# Patient Record
Sex: Female | Born: 1990 | Race: White | Hispanic: No | Marital: Single | Smoking: Never smoker
Health system: Southern US, Community
[De-identification: ages and names within clinical notes are randomized; demographics above are authoritative.]

## PROBLEM LIST (undated history)

## (undated) DIAGNOSIS — R87619 Unspecified abnormal cytological findings in specimens from cervix uteri: Secondary | ICD-10-CM

## (undated) DIAGNOSIS — Z9289 Personal history of other medical treatment: Secondary | ICD-10-CM

## (undated) DIAGNOSIS — Z23 Encounter for immunization: Secondary | ICD-10-CM

## (undated) HISTORY — DX: Personal history of other medical treatment: Z92.89

## (undated) HISTORY — DX: Encounter for immunization: Z23

## (undated) HISTORY — DX: Unspecified abnormal cytological findings in specimens from cervix uteri: R87.619

## (undated) HISTORY — PX: AUGMENTATION MAMMAPLASTY: SUR837

---
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
## 2008-04-21 ENCOUNTER — Ambulatory Visit: Payer: Self-pay | Admitting: Family Medicine

## 2008-11-30 ENCOUNTER — Ambulatory Visit: Payer: Self-pay | Admitting: Family Medicine

## 2010-05-24 ENCOUNTER — Inpatient Hospital Stay: Payer: Self-pay

## 2010-12-19 HISTORY — PX: COLPOSCOPY: SHX161

## 2011-10-29 ENCOUNTER — Ambulatory Visit: Payer: Self-pay

## 2011-10-29 LAB — RAPID INFLUENZA A&B ANTIGENS

## 2014-06-29 ENCOUNTER — Ambulatory Visit: Payer: Self-pay | Admitting: Physician Assistant

## 2014-06-29 LAB — URINALYSIS, COMPLETE
Bacteria: NEGATIVE
Bilirubin,UR: NEGATIVE
Blood: NEGATIVE
Glucose,UR: NEGATIVE
Ketone: NEGATIVE
Leukocyte Esterase: NEGATIVE
Nitrite: NEGATIVE
Ph: 7.5 (ref 5.0–8.0)
Protein: NEGATIVE
Specific Gravity: 1.02 (ref 1.000–1.030)

## 2014-06-29 LAB — PREGNANCY, URINE: Pregnancy Test, Urine: NEGATIVE m[IU]/mL

## 2016-07-05 LAB — HM PAP SMEAR: HM Pap smear: NEGATIVE

## 2016-12-28 ENCOUNTER — Encounter: Payer: Self-pay | Admitting: Advanced Practice Midwife

## 2016-12-28 ENCOUNTER — Ambulatory Visit (INDEPENDENT_AMBULATORY_CARE_PROVIDER_SITE_OTHER): Payer: Self-pay | Admitting: Advanced Practice Midwife

## 2016-12-28 VITALS — BP 122/74 | Ht 69.0 in | Wt 150.0 lb

## 2016-12-28 DIAGNOSIS — N898 Other specified noninflammatory disorders of vagina: Secondary | ICD-10-CM

## 2016-12-28 DIAGNOSIS — Z0189 Encounter for other specified special examinations: Secondary | ICD-10-CM

## 2016-12-28 DIAGNOSIS — Z113 Encounter for screening for infections with a predominantly sexual mode of transmission: Secondary | ICD-10-CM

## 2016-12-28 DIAGNOSIS — R5383 Other fatigue: Secondary | ICD-10-CM

## 2016-12-29 LAB — CBC
Hematocrit: 36.7 % (ref 34.0–46.6)
Hemoglobin: 12.2 g/dL (ref 11.1–15.9)
MCH: 30.7 pg (ref 26.6–33.0)
MCHC: 33.2 g/dL (ref 31.5–35.7)
MCV: 92 fL (ref 79–97)
Platelets: 235 10*3/uL (ref 150–379)
RBC: 3.97 x10E6/uL (ref 3.77–5.28)
RDW: 12.8 % (ref 12.3–15.4)
WBC: 6.4 10*3/uL (ref 3.4–10.8)

## 2016-12-29 LAB — TSH: TSH: 1.11 u[IU]/mL (ref 0.450–4.500)

## 2016-12-29 NOTE — Progress Notes (Signed)
S: Pt presents to clinic today with a number of complaints.  She had spotting for a week leading up to her last period. She usually has a regular period every 28 days.  She has noticed an increase in her vaginal discharge in the past week which she describes as milky white and thin. She denies other symptoms such as itching or irritation.  She has a new partner in the last six months and would like to be screened for STIs.  She has noticed in the last few months that she is always tired and would like her thyroid level checked. Discussed the possibility of iron deficiency anemia also.  She has noticed in the past few days that her urine is darker than normal. She admits to drinking two 16 oz bottles of water per day.  O: Vital Signs: BP 122/74   Ht  (1.753 m)   Wt 150 lb (68 kg)   LMP 12/11/2016   BMI 22.15 kg/m  Constitutional: Well nourished, well developed female in no acute distress.  HEENT: normal Skin: Warm and dry.  Cardiovascular: Regular rate and rhythm.   Extremity: symmetrical, without defect  Respiratory: Clear to auscultation bilateral. Normal respiratory effort Abdomen: soft, nontender, nondistended, no abnormal masses, no epigastric pain Back: no CVAT Neuro: DTRs 2+, Cranial nerves grossly intact Psych: Alert and Oriented x3. No memory deficits. Normal mood and affect.  MS: normal gait, normal bilateral lower extremity ROM/strength/stability.  Pelvic exam:  is not limited by body habitus EGBUS: within normal limits Vagina: within normal limits and with normal mucosa blood in the vault Cervix: appears normal  A: 26 y.o. G2P1 presenting with several uro/gyn issues  P:  1. Labs done today: NuSwab for STIs/Yeast/BV, CBC for hemoglobin level, TSH for symptoms of hypothyroid. Urinalysis: long dip normal 2. Encouraged increased hydration 3. Follow up as needed regarding results of labs   Tresea Mall, CNM

## 2017-01-03 LAB — NUSWAB VAGINITIS (VG)
Candida albicans, NAA: NEGATIVE
Candida glabrata, NAA: NEGATIVE
Trich vag by NAA: NEGATIVE

## 2017-03-27 ENCOUNTER — Ambulatory Visit: Payer: Self-pay | Admitting: Advanced Practice Midwife

## 2017-04-03 ENCOUNTER — Ambulatory Visit (INDEPENDENT_AMBULATORY_CARE_PROVIDER_SITE_OTHER): Payer: Medicaid Other | Admitting: Obstetrics and Gynecology

## 2017-04-03 ENCOUNTER — Encounter: Payer: Self-pay | Admitting: Obstetrics and Gynecology

## 2017-04-03 VITALS — BP 120/80 | HR 66 | Ht 69.0 in | Wt 149.0 lb

## 2017-04-03 DIAGNOSIS — R102 Pelvic and perineal pain: Secondary | ICD-10-CM | POA: Diagnosis not present

## 2017-04-03 DIAGNOSIS — N938 Other specified abnormal uterine and vaginal bleeding: Secondary | ICD-10-CM | POA: Diagnosis not present

## 2017-04-03 DIAGNOSIS — Z113 Encounter for screening for infections with a predominantly sexual mode of transmission: Secondary | ICD-10-CM

## 2017-04-03 DIAGNOSIS — Z3202 Encounter for pregnancy test, result negative: Secondary | ICD-10-CM

## 2017-04-03 LAB — POCT URINE PREGNANCY: Preg Test, Ur: NEGATIVE

## 2017-04-03 NOTE — Progress Notes (Signed)
Chief Complaint  Patient presents with  . Abdominal Pain    [facility] then started back on it,    HPI:      [name] is a 26 y.o. G2P1011 who LMP was Patient's last menstrual period was 03/08/2017., presents today for cramping for several wks and  spotting. She stopped her OCPs 5/18 and then restarted them with LMP 6/18. Since then, she has had cramping  and 1 day spotting, cramping improving this wk. She did not have to take any meds for pain. She was sex active with new partner 3 days after restarting OCPs. She had a few days of a d/c but that resolved. No urin sx, LBP, fevers, GI sx except constipation that is normal for her. No dyspareunia/postcoital bleeding.    There are no active problems to display for this patient.   Family History  Problem Relation Age of Onset  . Diabetes Mother   . Atrial fibrillation Father   . Diabetes Maternal Grandmother   . Atrial fibrillation Sister   . Aneurysm Brother        brain - half brother  . Cancer Paternal Grandfather        lung    Social History   Social History  . Marital status: Single    Spouse name: N/A  . Number of children: N/A  . 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 Smoking status: Never Smoker  . Smokeless tobacco: Never Used  . Alcohol use Yes     Comment: occ  . Drug use: No  . Sexual activity: Yes    Birth control/ protection: Pill   Other Topics Concern  . Not on file   Social History Narrative  . No narrative on file     Current Outpatient Prescriptions:  .  SPRINTEC 28 0.25-35 MG-MCG tablet, Take 1 tablet by mouth daily., Disp: , Rfl: 11  Review of Systems  Constitutional: Negative for fever.  Gastrointestinal: Negative for blood in stool, constipation, diarrhea, nausea and vomiting.  Genitourinary: Positive for pelvic pain, vaginal bleeding and vaginal discharge. Negative for dyspareunia, dysuria, flank  pain, frequency, hematuria, urgency and vaginal pain.  Musculoskeletal: Negative for back pain.  Skin: Negative for rash.     OBJECTIVE:   Vitals:  BP 120/80   Pulse 66   Ht 5\' 9"  (1.753 m)   Wt 149 lb (67.6 kg)   LMP 03/08/2017   BMI 22.00 kg/m   Physical Exam  Constitutional: She is oriented to person, place, and time and well-developed, well-nourished, and in no distress. Vital signs are normal.  Abdominal: There is no tenderness.  Genitourinary: Uterus normal, cervix normal, right adnexa normal, left adnexa normal and vulva normal. Uterus is not enlarged. Cervix exhibits no motion tenderness and no tenderness. Right adnexum displays no mass and no tenderness. Left adnexum displays no mass and no tenderness. Vulva exhibits no erythema, no exudate, no lesion, no rash and no tenderness. Vagina exhibits no lesion. Thin  clear and vaginal discharge found.  Neurological: She is oriented to person, place, and time.  Vitals reviewed.   Results: Results for orders placed or performed in visit on 04/03/17 (from the past 24 hour(s))  POCT urine pregnancy     Status: Normal   Collection Time: 04/03/17 10:44 AM  Result Value Ref Range   Preg Test, Ur Negative Negative  Assessment/Plan: DUB (dysfunctional uterine bleeding) - For 1 day on restart of OCPs. Neg UPT. Check nuswab. Follow expectantly with next pack. - Plan: POCT urine pregnancy  Pelvic cramping - neg UPT/exam. Check nuswab. Will f/u with results - Plan: POCT urine pregnancy, Chlamydia/Gonococcus/Trichomonas, NAA  Screening for STD (sexually transmitted disease) - Plan: Chlamydia/Gonococcus/Trichomonas, NAA     Return if symptoms worsen or fail to improve.  Alicia B. Copland, PA-C 04/03/2017 10:45 AM

## 2017-04-06 LAB — CHLAMYDIA/GONOCOCCUS/TRICHOMONAS, NAA
Chlamydia by NAA: NEGATIVE
Gonococcus by NAA: NEGATIVE
Trich vag by NAA: NEGATIVE

## 2017-05-20 ENCOUNTER — Ambulatory Visit (INDEPENDENT_AMBULATORY_CARE_PROVIDER_SITE_OTHER): Payer: Self-pay

## 2017-05-20 ENCOUNTER — Ambulatory Visit
Admission: EM | Admit: 2017-05-20 | Discharge: 2017-05-20 | Disposition: A | Discharge status: Home / Self Care | Payer: Self-pay | Attending: Family Medicine | Admitting: Family Medicine

## 2017-05-20 DIAGNOSIS — S9032XA Contusion of left foot, initial encounter: Secondary | ICD-10-CM

## 2017-05-20 DIAGNOSIS — S92502A Displaced unspecified fracture of left lesser toe(s), initial encounter for closed fracture: Secondary | ICD-10-CM

## 2017-05-20 NOTE — ED Triage Notes (Signed)
Pt reports [place]. Left foot 2nd toe was bent to the side but they straightened it back out right away. Now with bruising and swelling to the area. Pain 2/10

## 2017-05-20 NOTE — Discharge Instructions (Signed)
Rest, ice, buddy tape. Follow up with podiatry as needed.   Follow up with your primary care physician this week as needed. Return to Urgent care for new or worsening concerns.

## 2017-05-20 NOTE — ED Provider Notes (Signed)
[facility] ____________________________________________  Time seen: Approximately 3:20 PM  I have reviewed the triage vital signs and the nursing notes.   HISTORY  Chief Complaint Foot Pain   HPI Diana [name] is a 26 y.o. female  presenting for evaluation of right foot second toe pain after injury occurred this past Thursday. Patient states that she stubbed her toe directly on a boat tie, causing pain. Patient reports that initially the toe was laterally Displaced appearing, and states that a friend pulled on the toe several times to make it straight again. Patient states she has had continued pain and swelling to the same toe. States some pain at the base of the third and fourth toes. Denies any other pain or injury. States has been applying ice and resting as well as buddy taping some. No other alleviating measures attempted. States pain is worse with direct palpation and ambulating, but reports has continued to ambulate well. Denies break in skin.  Denies chest pain, shortness of breath, abdominal pain, or rash. Denies recent sickness. Denies recent antibiotic use.   Patient's last menstrual period was 05/06/2017. denies pregnancy.    Past Medical History:  Diagnosis Date  . Abnormal Pap smear of cervix   . History of Papanicolaou smear of cervix 05/10/2015; [id number]   neg; neg  . Immunization, viral disease    gardasil completed    There are no active problems to display for this patient.   Past Surgical History:  Procedure Laterality Date  . AUGMENTATION MAMMAPLASTY    . COLPOSCOPY  12/19/2010   lgsil     No current facility-administered medications for this encounter.   Current Outpatient Prescriptions:  .  SPRINTEC 28 0.25-35 MG-MCG tablet, Take 1 tablet by mouth daily., Disp: , Rfl: 11  Allergies Patient has no known allergies.  Family History  Problem Relation Age of Onset  . Diabetes Mother   . Atrial fibrillation Father   . Diabetes  Maternal Grandmother   . Atrial fibrillation Sister   . Aneurysm Brother        brain - half brother  . Cancer Paternal Grandfather        lung    Social History Social History  Substance Use Topics  . Smoking status: Never Smoker  . Smokeless tobacco: Never Used  . Alcohol use Yes     Comment: occ    Review of Systems Constitutional: No fever/chills. Cardiovascular: Denies chest pain. Respiratory: Denies shortness of breath. Gastrointestinal: No abdominal pain.   Musculoskeletal: Negative for back pain. As above. Skin: Negative for rash.   ____________________________________________   PHYSICAL EXAM:  VITAL SIGNS: ED Triage Vitals  Enc Vitals Group     BP 05/20/17 1411 126/71     Pulse Rate 05/20/17 1411 61     Resp 05/20/17 1411 16     Temp 05/20/17 1411 98.3 F (36.8 C)     Temp Source 05/20/17 1411 Oral     SpO2 05/20/17 1411 100 %     Weight 05/20/17 1409 148 lb (67.1 kg)     Height 05/20/17 1409 5\' 9"  (1.753 m)     Head Circumference --      Peak Flow --      Pain Score 05/20/17 1409 2     Pain Loc --      Pain Edu? --      Excl. in GC? --     Constitutional: Alert and oriented. Well appearing and in no acute distress. Cardiovascular:  Normal rate, regular rhythm. Grossly normal heart sounds.  Good peripheral circulation. Respiratory: Normal respiratory effort without tachypnea nor retractions. Breath sounds are clear and equal bilaterally. No wheezes, rales, rhonchi. Musculoskeletal: Bilateral pedal pulses equal and easily palpated. Except: Left second toe mild to moderate ecchymosis and edema, moderate tenderness palpation distally and mild proximally, slightly limited range of motion, normal distal sensation and capillary refill, mild tenderness at base of third and fourth proximal phalanxes at MTP joint with minimal ecchymosis, left foot otherwise nontender. Ambulatory with mildly antalgic gait. Neurologic:  Normal speech and language.Speech is normal.  No gait instability.  Skin:  Skin is warm, dry. Psychiatric: Mood and affect are normal. Speech and behavior are normal. Patient exhibits appropriate insight and judgment   ___________________________________________   LABS (all labs ordered are listed, but only abnormal results are displayed)  Labs Reviewed - No data to display ____________________________________________  RADIOLOGY  Dg Foot Complete Left  Addendum Date: 05/20/2017   ADDENDUM REPORT: 05/20/2017 15:17 ADDENDUM: On additional review, there is an oblique nondisplaced fracture involving the mid aspect of the 2nd proximal phalanx, visualized on the oblique radiograph. This was discussed with Helene Kelp at the time of addendum. Electronically Signed   By: Charline Bills M.D.   On: 05/20/2017 15:17   Result Date: 05/20/2017 CLINICAL DATA:  Toe pain EXAM: LEFT FOOT - COMPLETE 3+ VIEW COMPARISON:  None. FINDINGS: No fracture or dislocation is seen. The joint spaces are preserved. The visualized soft tissues are unremarkable. IMPRESSION: Negative. Electronically Signed: By: Charline Bills M.D. On: 05/20/2017 15:07   ____________________________________________   PROCEDURES Procedures    buddy tape 1/2 toes and post op shoe applied.   INITIAL IMPRESSION / ASSESSMENT AND PLAN / ED COURSE  Pertinent labs & imaging results that were available during my care of the patient were reviewed by me and considered in my medical decision making (see chart for details).  Well-appearing patient. No acute distress. Presents for left toe pain post mechanical injury. Left toe fracture nondisplaced. Encouraged buddy taping, postop shoe, ice, rest and close monitoring. Follow-up with PCP or podiatry as needed.  Discussed follow up and return parameters including no resolution or any worsening concerns. Patient verbalized understanding and agreed to plan.   ____________________________________________   FINAL CLINICAL  IMPRESSION(S) / ED DIAGNOSES  Final diagnoses:  Closed fracture of phalanx of left second toe, initial encounter  Contusion of left foot, initial encounter     New Prescriptions   No medications on file    Note: This dictation was prepared with Dragon dictation along with smaller phrase technology. Any transcriptional errors that result from this process are unintentional.         Renford Dills, NP 05/20/17 1533

## 2017-07-25 ENCOUNTER — Other Ambulatory Visit: Payer: Self-pay | Admitting: Obstetrics and Gynecology

## 2017-07-30 ENCOUNTER — Other Ambulatory Visit: Payer: Self-pay | Admitting: Obstetrics and Gynecology

## 2017-07-30 MED ORDER — SPRINTEC 28 0.25-35 MG-MCG PO TABS: 1.0000 | ORAL_TABLET | Freq: Every day | ORAL | 0 refills | Status: DC

## 2017-07-30 NOTE — Telephone Encounter (Signed)
Pt aware refill eRx'd. 

## 2017-07-30 NOTE — Telephone Encounter (Signed)
Pt is calling needing a medication refill on her Birthcontrol. [facility] [place]. Pt is schedule annual is 08/23/17 with ABC

## 2017-08-23 ENCOUNTER — Other Ambulatory Visit: Payer: Self-pay | Admitting: Obstetrics and Gynecology

## 2017-08-23 ENCOUNTER — Ambulatory Visit (INDEPENDENT_AMBULATORY_CARE_PROVIDER_SITE_OTHER): Payer: Medicaid Other | Admitting: Obstetrics and Gynecology

## 2017-08-23 ENCOUNTER — Encounter: Payer: Self-pay | Admitting: Obstetrics and Gynecology

## 2017-08-23 VITALS — BP 110/70 | HR 70 | Ht 69.0 in | Wt 156.0 lb

## 2017-08-23 DIAGNOSIS — Z124 Encounter for screening for malignant neoplasm of cervix: Secondary | ICD-10-CM | POA: Diagnosis not present

## 2017-08-23 DIAGNOSIS — Z113 Encounter for screening for infections with a predominantly sexual mode of transmission: Secondary | ICD-10-CM

## 2017-08-23 DIAGNOSIS — Z309 Encounter for contraceptive management, unspecified: Secondary | ICD-10-CM

## 2017-08-23 DIAGNOSIS — Z3041 Encounter for surveillance of contraceptive pills: Secondary | ICD-10-CM

## 2017-08-23 DIAGNOSIS — Z01419 Encounter for gynecological examination (general) (routine) without abnormal findings: Secondary | ICD-10-CM

## 2017-08-23 MED ORDER — NORGESTIMATE-ETH ESTRADIOL 0.25-35 MG-MCG PO TABS: 1.0000 | ORAL_TABLET | Freq: Every day | ORAL | 12 refills | Status: DC

## 2017-08-23 NOTE — Patient Instructions (Signed)
I value your feedback and entrusting us with your care. If you get a [facility] patient survey, I would appreciate you taking the time to let us know about your experience today. Thank you! 

## 2017-08-23 NOTE — Progress Notes (Signed)
PCP:  Patient, No Pcp Per   Chief Complaint  Patient presents with  . Gynecologic Exam     HPI:      [name] is a 26 y.o. G2P1011 who LMP was Patient's last menstrual period was 08/16/2017., presents today for her annual examination.  Her menses are regular every 28-30 days, lasting 5 days.  Dysmenorrhea mild, occurring first 1-2 days of flow. She does not usually have intermenstrual bleeding but had BTB the 3rd wk of OCPs this month, without late/missed OCPs. First time pt has had this. Normal menses this wk.  Sex activity: single partner, contraception - OCP (estrogen/progesterone).  Last Pap: July 06, 2016  Results were: no abnormalities  Hx of STDs: HPV on pap  There is no FH of breast cancer. There is no FH of ovarian cancer. The patient does not do self-breast exams.  Tobacco use: none Alcohol use: social drinker No drug use.  Exercise: moderately active  She does get adequate calcium and Vitamin D in her diet.  Gardasil completed   Past Medical History:  Diagnosis Date  . Abnormal Pap smear of cervix   . History of Papanicolaou smear of cervix 05/10/2015; [id number]   neg; neg  . Immunization, viral disease    gardasil completed    Past Surgical History:  Procedure Laterality Date  . AUGMENTATION MAMMAPLASTY    . COLPOSCOPY  12/19/2010   lgsil    Family History  Problem Relation Age of Onset  . Diabetes Mother   . Atrial fibrillation Father   . Diabetes Maternal Grandmother   . Atrial fibrillation Sister   . Aneurysm Brother        brain - half brother  . Cancer Paternal Grandfather        lung    Social History   Socioeconomic History  . Marital status: Single    Spouse name: Not on file  . Number of children: Not on file  . Years of education: 2614  . Highest education level: Not on file  Social Needs  . Financial resource strain: Not on file  . Food insecurity - worry: Not on file  . Food insecurity - inability: Not on  file  . Transportation needs - medical: Not on file  . Transportation needs - non-medical: Not on file  Occupational History  . 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[employer]  Tobacco Use  . Smoking status: Never Smoker  . Smokeless tobacco: Never Used  Substance and Sexual Activity  . Alcohol use: Yes    Comment: occ  . Drug use: No  . Sexual activity: Yes    Birth control/protection: Pill  Other Topics Concern  . Not on file  Social History Narrative  . Not on file    No outpatient medications have been marked as taking for the 08/23/17 encounter (Office Visit) with Earnestine Tuohey B, PA-C.     ROS:  Review of Systems  Constitutional: Positive for fatigue. Negative for fever and unexpected weight change.  Respiratory: Negative for cough, shortness of breath and wheezing.   Cardiovascular: Negative for chest pain, palpitations and leg swelling.  Gastrointestinal: Negative for blood in stool, constipation, diarrhea, nausea and vomiting.  Endocrine: Negative for cold intolerance, heat intolerance and polyuria.  Genitourinary: Negative for dyspareunia, dysuria, flank pain, frequency, genital sores, hematuria, menstrual problem, pelvic pain, urgency, vaginal bleeding, vaginal discharge and vaginal pain.  Musculoskeletal: Negative for back pain, joint swelling and myalgias.  Skin: Negative for rash.  Neurological: Positive  for headaches. Negative for dizziness, syncope, light-headedness and numbness.  Hematological: Negative for adenopathy.  Psychiatric/Behavioral: Negative for agitation, confusion, sleep disturbance and suicidal ideas. The patient is not nervous/anxious.      Objective: BP 110/70   Pulse 70   Ht 5\' 9"  (1.753 m)   Wt 156 lb (70.8 kg)   LMP 08/16/2017   BMI 23.04 kg/m    Physical Exam  Constitutional: She is oriented to person, place, and time. She appears well-developed and well-nourished.  Genitourinary: Vagina normal and uterus normal. There is no rash or tenderness on  the right labia. There is no rash or tenderness on the left labia. No erythema or tenderness in the vagina. No vaginal discharge found. Right adnexum does not display mass and does not display tenderness. Left adnexum does not display mass and does not display tenderness. Cervix does not exhibit motion tenderness or polyp. Uterus is not enlarged or tender.  Neck: Normal range of motion. No thyromegaly present.  Cardiovascular: Normal rate, regular rhythm and normal heart sounds.  No murmur heard. Pulmonary/Chest: Effort normal and breath sounds normal. Right breast exhibits no mass, no nipple discharge, no skin change and no tenderness. Left breast exhibits no mass, no nipple discharge, no skin change and no tenderness.  Abdominal: Soft. There is no tenderness. There is no guarding.  Musculoskeletal: Normal range of motion.  Neurological: She is alert and oriented to person, place, and time. No cranial nerve deficit.  Psychiatric: She has a normal mood and affect. Her behavior is normal.  Vitals reviewed.   Assessment/Plan: Encounter for annual routine gynecological examination  Cervical cancer screening - Plan: IGP,CtNgTv,rfx Aptima HPV ASCU  Screening for STD (sexually transmitted disease) - Plan: IGP,CtNgTv,rfx Aptima HPV ASCU  Encounter for surveillance of contraceptive pills - OCP RF. - Plan: norgestimate-ethinyl estradiol (SPRINTEC 28) 0.25-35 MG-MCG tablet  Meds ordered this encounter  Medications  . norgestimate-ethinyl estradiol (SPRINTEC 28) 0.25-35 MG-MCG tablet    Sig: Take 1 tablet by mouth daily.    Dispense:  1 Package    Refill:  12             GYN counsel adequate intake of calcium and vitamin D, diet and exercise     F/U  Return in about 1 year (around 08/23/2018).  Monae Topping B. Harwood Nall, PA-C 08/23/2017 9:03 AM

## 2017-08-25 LAB — IGP,CTNGTV,RFX APTIMA HPV ASCU
Chlamydia, Nuc. Acid Amp: NEGATIVE
Gonococcus, Nuc. Acid Amp: NEGATIVE
PAP Smear Comment: 0
Trich vag by NAA: NEGATIVE

## 2017-12-04 ENCOUNTER — Ambulatory Visit (INDEPENDENT_AMBULATORY_CARE_PROVIDER_SITE_OTHER): Payer: Medicaid Other | Admitting: Obstetrics and Gynecology

## 2017-12-04 ENCOUNTER — Encounter: Payer: Self-pay | Admitting: Obstetrics and Gynecology

## 2017-12-04 VITALS — BP 110/60 | Ht 69.0 in | Wt 164.0 lb

## 2017-12-04 DIAGNOSIS — N3001 Acute cystitis with hematuria: Secondary | ICD-10-CM

## 2017-12-04 LAB — POCT URINALYSIS DIPSTICK
Bilirubin, UA: NEGATIVE
Glucose, UA: NEGATIVE
Ketones, UA: NEGATIVE
Nitrite, UA: NEGATIVE
Protein, UA: NEGATIVE
Spec Grav, UA: 1.015 (ref 1.010–1.025)
pH, UA: 6 (ref 5.0–8.0)

## 2017-12-04 MED ORDER — NITROFURANTOIN MONOHYD MACRO 100 MG PO CAPS: 100.0000 mg | ORAL_CAPSULE | Freq: Two times a day (BID) | ORAL | 0 refills | Status: AC

## 2017-12-04 NOTE — Patient Instructions (Signed)
I value your feedback and entrusting us with your care. If you get a [facility] patient survey, I would appreciate you taking the time to let us know about your experience today. Thank you! 

## 2017-12-04 NOTE — Progress Notes (Signed)
Chief Complaint  Patient presents with  . Urinary Tract Infection    HPI:      [name] is a 27 y.o. G2P1011 who LMP was Patient's last menstrual period was 11/14/2017., presents today for UTI sx of urinary frequency, urgency, discomfort, cloudy urine, urine odor for the past few days and RT LBP since this AM. No pelvic pain/fevers. No vag sx. No hx of UTIs in the past.    Past Medical History:  Diagnosis Date  . Abnormal Pap smear of cervix   . History of Papanicolaou smear of cervix 05/10/2015; [id number]   neg; neg  . Immunization, viral disease    gardasil completed    Past Surgical History:  Procedure Laterality Date  . AUGMENTATION MAMMAPLASTY    . COLPOSCOPY  12/19/2010   lgsil    Family History  Problem Relation Age of Onset  . Diabetes Mother   . Atrial fibrillation Father   . Diabetes Maternal Grandmother   . Atrial fibrillation Sister   . Aneurysm Brother        brain - half brother  . Cancer Paternal Grandfather        lung    Social History   Socioeconomic History  . Marital status: Single    Spouse name: Not on file  . Number of children: Not on file  . Years of education: 1714  . Highest education level: Not on file  Social Needs  . Financial resource strain: Not on file  . Food insecurity - worry: Not on file  . Food insecurity - inability: Not on file  . Transportation needs - medical: Not on file  . Transportation needs - non-medical: Not on file  Occupational History  . 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[employer]  Tobacco Use  . Smoking status: Never Smoker  . Smokeless tobacco: Never Used  Substance and Sexual Activity  . Alcohol use: Yes    Comment: occ  . Drug use: No  . Sexual activity: Yes    Birth control/protection: Pill  Other Topics Concern  . Not on file  Social History Narrative  . Not on file     Current Outpatient Medications:  .  norgestimate-ethinyl estradiol (SPRINTEC 28) 0.25-35 MG-MCG tablet, Take 1 tablet by mouth  daily., Disp: 1 Package, Rfl: 12 .  nitrofurantoin, macrocrystal-monohydrate, (MACROBID) 100 MG capsule, Take 1 capsule (100 mg total) by mouth 2 (two) times daily for 5 days., Disp: 10 capsule, Rfl: 0   ROS:  Review of Systems  Constitutional: Negative for fever.  Gastrointestinal: Negative for blood in stool, constipation, diarrhea, nausea and vomiting.  Genitourinary: Positive for dysuria, frequency and urgency. Negative for dyspareunia, flank pain, hematuria, vaginal bleeding, vaginal discharge and vaginal pain.  Musculoskeletal: Positive for back pain.  Skin: Negative for rash.     OBJECTIVE:   Vitals:  BP 110/60   Ht 5\' 9"  (1.753 m)   Wt 164 lb (74.4 kg)   LMP 11/14/2017   BMI 24.22 kg/m   Physical Exam  Constitutional: She is oriented to person, place, and time and well-developed, well-nourished, and in no distress.  Pulmonary/Chest: Effort normal.  Abdominal: There is no CVA tenderness.  Musculoskeletal: Normal range of motion.  Neurological: She is alert and oriented to person, place, and time.  Psychiatric: Affect and judgment normal.  Vitals reviewed.   Results: Results for orders placed or performed in visit on 12/04/17 (from the past 24 hour(s))  POCT Urinalysis Dipstick     Status:  Abnormal   Collection Time: 12/04/17  9:56 AM  Result Value Ref Range   Color, UA yellow    Clarity, UA cloudy    Glucose, UA neg    Bilirubin, UA neg    Ketones, UA neg    Spec Grav, UA 1.015 1.010 - 1.025   Blood, UA small    pH, UA 6.0 5.0 - 8.0   Protein, UA neg    Urobilinogen, UA  0.2 or 1.0 E.U./dL   Nitrite, UA neg    Leukocytes, UA Moderate (2+) (A) Negative   Appearance     Odor      Assessment/Plan: Acute cystitis with hematuria - Pos dip. Check C&S. Rx macrobid. F/u prn.  - Plan: POCT Urinalysis Dipstick, Urine Culture, nitrofurantoin, macrocrystal-monohydrate, (MACROBID) 100 MG capsule   Meds ordered this encounter  Medications  . nitrofurantoin,  macrocrystal-monohydrate, (MACROBID) 100 MG capsule    Sig: Take 1 capsule (100 mg total) by mouth 2 (two) times daily for 5 days.    Dispense:  10 capsule    Refill:  0    Order Specific Question:   Supervising Provider    Answer:   Nadara Mustard [[id number]]     Return if symptoms worsen or fail to improve.  Yalissa Fink B. Melquan Ernsberger, PA-C 12/04/2017 9:58 AM

## 2017-12-06 LAB — URINE CULTURE

## 2018-07-24 ENCOUNTER — Other Ambulatory Visit: Payer: Self-pay | Admitting: Obstetrics and Gynecology

## 2018-07-24 ENCOUNTER — Other Ambulatory Visit: Payer: Self-pay

## 2018-07-24 DIAGNOSIS — Z3041 Encounter for surveillance of contraceptive pills: Secondary | ICD-10-CM

## 2018-07-24 MED ORDER — NORGESTIMATE-ETH ESTRADIOL 0.25-35 MG-MCG PO TABS: 1.0000 | ORAL_TABLET | Freq: Every day | ORAL | 0 refills | Status: DC

## 2018-08-26 ENCOUNTER — Encounter: Payer: Self-pay | Admitting: Obstetrics and Gynecology

## 2018-08-26 ENCOUNTER — Other Ambulatory Visit (HOSPITAL_COMMUNITY)
Admission: RE | Admit: 2018-08-26 | Discharge: 2018-08-26 | Disposition: A | Discharge status: Home / Self Care | Payer: Medicaid Other | Source: Ambulatory Visit | Attending: Obstetrics and Gynecology | Admitting: Obstetrics and Gynecology

## 2018-08-26 ENCOUNTER — Ambulatory Visit (INDEPENDENT_AMBULATORY_CARE_PROVIDER_SITE_OTHER): Payer: Medicaid Other | Admitting: Obstetrics and Gynecology

## 2018-08-26 VITALS — BP 116/60 | HR 65 | Ht 69.0 in | Wt 162.0 lb

## 2018-08-26 DIAGNOSIS — Z124 Encounter for screening for malignant neoplasm of cervix: Secondary | ICD-10-CM | POA: Insufficient documentation

## 2018-08-26 DIAGNOSIS — Z0000 Encounter for general adult medical examination without abnormal findings: Secondary | ICD-10-CM | POA: Diagnosis not present

## 2018-08-26 DIAGNOSIS — Z01419 Encounter for gynecological examination (general) (routine) without abnormal findings: Secondary | ICD-10-CM

## 2018-08-26 DIAGNOSIS — Z3041 Encounter for surveillance of contraceptive pills: Secondary | ICD-10-CM

## 2018-08-26 DIAGNOSIS — Z3169 Encounter for other general counseling and advice on procreation: Secondary | ICD-10-CM

## 2018-08-26 MED ORDER — NORGESTIMATE-ETH ESTRADIOL 0.25-35 MG-MCG PO TABS: 1.0000 | ORAL_TABLET | Freq: Every day | ORAL | 3 refills | Status: DC

## 2018-08-26 NOTE — Patient Instructions (Signed)
I value your feedback and entrusting us with your care. If you get a [facility] patient survey, I would appreciate you taking the time to let us know about your experience today. Thank you! 

## 2018-08-26 NOTE — Progress Notes (Signed)
PCP:  Patient, No Pcp Per   Chief Complaint  Patient presents with  . Gynecologic Exam     HPI:      [name] is a 27 y.o. G2P1011 who LMP was Patient's last menstrual period was 08/17/2018 (approximate)., presents today for her annual examination.  Her menses are regular every 28-30 days, lasting 5 days.  Dysmenorrhea mild, occurring first 1-2 days of flow. She occas has intermenstrual bleeding, usually with late/missed OCPs.   Sex activity: single partner, contraception - OCP (estrogen/progesterone). May want to conceive next yr. Last Pap: 08/23/17 Results were: no abnormalities  Hx of STDs: HPV on pap  There is no FH of breast cancer. There is no FH of ovarian cancer. The patient does do self-breast exams.  Tobacco use: none Alcohol use: social drinker No drug use.  Exercise: not active  She does not get adequate calcium and Vitamin D in her diet.  Gardasil completed   Past Medical History:  Diagnosis Date  . Abnormal Pap smear of cervix   . History of Papanicolaou smear of cervix 05/10/2015; [id number]   neg; neg  . Immunization, viral disease    gardasil completed    Past Surgical History:  Procedure Laterality Date  . AUGMENTATION MAMMAPLASTY    . COLPOSCOPY  12/19/2010   lgsil    Family History  Problem Relation Age of Onset  . Diabetes Mother        Type 2  . Atrial fibrillation Father   . Diabetes Maternal Grandmother   . Atrial fibrillation Sister   . Aneurysm Brother        brain - half brother  . Cancer Paternal Grandfather        lung    Social History   Socioeconomic History  . Marital status: Single    Spouse name: Not on file  . Number of children: Not on file  . Years of education: 7014  . Highest education level: Not on file  Occupational History  . 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IR STYLIST  Social Needs  . Financial resource strain: Not on file  . Food insecurity:    Worry: Not on file    Inability: Not on file  . Transportation  needs:    Medical: Not on file    Non-medical: Not on file  Tobacco Use  . Smoking status: Never Smoker  . Smokeless tobacco: Never Used  Substance and Sexual Activity  . Alcohol use: Yes    Comment: occ  . Drug use: No  . Sexual activity: Yes    Birth control/protection: Pill  Lifestyle  . Physical activity:    Days per week: Not on file    Minutes per session: Not on file  . Stress: Not on file  Relationships  . Social connections:    Talks on phone: Not on file    Gets together: Not on file    Attends religious service: Not on file    Active member of club or organization: Not on file    Attends meetings of clubs or organizations: Not on file    Relationship status: Not on file  . Intimate partner violence:    Fear of current or ex partner: Not on file    Emotionally abused: Not on file    Physically abused: Not on file    Forced sexual activity: Not on file  Other Topics Concern  . Not on file  Social History Narrative  . Not on file  Current Meds  Medication Sig  . norgestimate-ethinyl estradiol (SPRINTEC 28) 0.25-35 MG-MCG tablet Take 1 tablet by mouth daily.  . [DISCONTINUED] norgestimate-ethinyl estradiol (SPRINTEC 28) 0.25-35 MG-MCG tablet Take 1 tablet by mouth daily.     ROS:  Review of Systems  Constitutional: Positive for fatigue. Negative for fever and unexpected weight change.  Respiratory: Negative for cough, shortness of breath and wheezing.   Cardiovascular: Negative for chest pain, palpitations and leg swelling.  Gastrointestinal: Positive for diarrhea. Negative for blood in stool, constipation, nausea and vomiting.  Endocrine: Negative for cold intolerance, heat intolerance and polyuria.  Genitourinary: Negative for dyspareunia, dysuria, flank pain, frequency, genital sores, hematuria, menstrual problem, pelvic pain, urgency, vaginal bleeding, vaginal discharge and vaginal pain.  Musculoskeletal: Negative for back pain, joint swelling and  myalgias.  Skin: Negative for rash.  Neurological: Positive for dizziness and headaches. Negative for syncope, light-headedness and numbness.  Hematological: Negative for adenopathy.  Psychiatric/Behavioral: Positive for agitation. Negative for confusion, sleep disturbance and suicidal ideas. The patient is not nervous/anxious.      Objective: BP 116/60   Pulse 65   Ht 5\' 9"  (1.753 m)   Wt 162 lb (73.5 kg)   LMP 08/17/2018 (Approximate)   BMI 23.92 kg/m    Physical Exam  Constitutional: She is oriented to person, place, and time. She appears well-developed and well-nourished.  Genitourinary: Vagina normal and uterus normal. There is no rash or tenderness on the right labia. There is no rash or tenderness on the left labia. No erythema or tenderness in the vagina. No vaginal discharge found. Right adnexum does not display mass and does not display tenderness. Left adnexum does not display mass and does not display tenderness. Cervix does not exhibit motion tenderness or polyp. Uterus is not enlarged or tender.  Neck: Normal range of motion. No thyromegaly present.  Cardiovascular: Normal rate, regular rhythm and normal heart sounds.  No murmur heard. Pulmonary/Chest: Effort normal and breath sounds normal. Right breast exhibits no mass, no nipple discharge, no skin change and no tenderness. Left breast exhibits no mass, no nipple discharge, no skin change and no tenderness.  Abdominal: Soft. There is no tenderness. There is no guarding.  Musculoskeletal: Normal range of motion.  Neurological: She is alert and oriented to person, place, and time. No cranial nerve deficit.  Psychiatric: She has a normal mood and affect. Her behavior is normal.  Vitals reviewed.   Assessment/Plan: Encounter for annual routine gynecological examination  Cervical cancer screening - Plan: Cytology - PAP  Encounter for surveillance of contraceptive pills - OCP RF. - Plan: norgestimate-ethinyl estradiol  (SPRINTEC 28) 0.25-35 MG-MCG tablet  Pre-conception counseling - STart PNVs. F/u prn.   Meds ordered this encounter  Medications  . norgestimate-ethinyl estradiol (SPRINTEC 28) 0.25-35 MG-MCG tablet    Sig: Take 1 tablet by mouth daily.    Dispense:  3 Package    Refill:  3    Order Specific Question:   Supervising Provider    Answer:   Nadara Mustard [[id number]]             GYN counsel adequate intake of calcium and vitamin D, diet and exercise     F/U  Return in about 1 year (around 08/27/2019).  Julienne Vogler B. Damian Buckles, PA-C 08/26/2018 2:02 PM

## 2018-08-28 LAB — CYTOLOGY - PAP: Diagnosis: NEGATIVE

## 2018-09-25 NOTE — L&D Delivery Note (Signed)
Date of delivery: 05/17/2019 Estimated Date of Delivery: 05/24/19 Patient's last menstrual period was 08/17/2018 (approximate). EGA: [redacted]w[redacted]d  Delivery Note At 8:46 PM a viable female was delivered via Vaginal, Spontaneous (Presentation: OA;  ROA).  APGAR: 8, 9; weight:  3740 g, 8#4oz.   Placenta status: spontaneous, intact.  Cord:  with the following complications: none.  Cord pH: NA  Patient labored down and pushed well to deliver a viable female infant.  The head followed by shoulders, which delivered without difficulty, and the rest of the body.  Body nuchal cord noted reduced after delivery.  Baby to mom's chest.  Cord clamped and cut after 3 min delay.  No cord blood obtained.  Placenta delivered spontaneously, intact, with a 3-vessel cord.   All counts correct.  Hemostasis obtained with IV pitocin and fundal massage.     Anesthesia: epidural  Episiotomy: none  Lacerations:  Superficial bilateral labial abrasions hemostatic Suture Repair: no repair Est. Blood Loss (mL):  400  Mom to postpartum.  Baby to Couplet care / Skin to Skin.  Rod Can, CNM 05/17/2019, 9:13 PM

## 2018-09-26 ENCOUNTER — Encounter: Payer: Medicaid Other | Admitting: Obstetrics and Gynecology

## 2018-10-08 ENCOUNTER — Encounter: Payer: Self-pay | Admitting: Certified Nurse Midwife

## 2018-10-09 ENCOUNTER — Encounter: Payer: Medicaid Other | Admitting: Obstetrics and Gynecology

## 2018-10-09 ENCOUNTER — Other Ambulatory Visit (HOSPITAL_COMMUNITY)
Admission: RE | Admit: 2018-10-09 | Discharge: 2018-10-09 | Disposition: A | Discharge status: Home / Self Care | Payer: Medicaid Other | Source: Ambulatory Visit | Attending: Obstetrics and Gynecology | Admitting: Obstetrics and Gynecology

## 2018-10-09 ENCOUNTER — Encounter: Payer: Self-pay | Admitting: Maternal Newborn

## 2018-10-09 ENCOUNTER — Ambulatory Visit (INDEPENDENT_AMBULATORY_CARE_PROVIDER_SITE_OTHER): Payer: Medicaid Other | Admitting: Maternal Newborn

## 2018-10-09 ENCOUNTER — Ambulatory Visit (INDEPENDENT_AMBULATORY_CARE_PROVIDER_SITE_OTHER): Payer: Medicaid Other

## 2018-10-09 ENCOUNTER — Other Ambulatory Visit: Payer: Self-pay | Admitting: Maternal Newborn

## 2018-10-09 VITALS — BP 120/80 | Wt 168.0 lb

## 2018-10-09 DIAGNOSIS — Z3481 Encounter for supervision of other normal pregnancy, first trimester: Secondary | ICD-10-CM | POA: Diagnosis not present

## 2018-10-09 DIAGNOSIS — Z3201 Encounter for pregnancy test, result positive: Secondary | ICD-10-CM | POA: Diagnosis not present

## 2018-10-09 DIAGNOSIS — Z3A01 Less than 8 weeks gestation of pregnancy: Secondary | ICD-10-CM

## 2018-10-09 DIAGNOSIS — Z3687 Encounter for antenatal screening for uncertain dates: Secondary | ICD-10-CM

## 2018-10-09 DIAGNOSIS — Z369 Encounter for antenatal screening, unspecified: Secondary | ICD-10-CM

## 2018-10-09 DIAGNOSIS — Z113 Encounter for screening for infections with a predominantly sexual mode of transmission: Secondary | ICD-10-CM | POA: Insufficient documentation

## 2018-10-09 DIAGNOSIS — N926 Irregular menstruation, unspecified: Secondary | ICD-10-CM

## 2018-10-09 DIAGNOSIS — Z3480 Encounter for supervision of other normal pregnancy, unspecified trimester: Secondary | ICD-10-CM | POA: Insufficient documentation

## 2018-10-09 LAB — POCT URINALYSIS DIPSTICK OB
Glucose, UA: NEGATIVE
POC,PROTEIN,UA: NEGATIVE

## 2018-10-09 LAB — POCT URINE PREGNANCY: Preg Test, Ur: POSITIVE — AB

## 2018-10-09 NOTE — Progress Notes (Signed)
10/09/2018   Chief Complaint: Amenorrhea, positive home pregnancy test, desires prenatal care.  Transfer of Care Patient: no  History of Present Illness: Diana [name] is a 28 y.o. G3P1011 at [redacted]w[redacted]d based on Patient's last menstrual period on 08/17/2018 (approximate), with an Estimated Date of Delivery: 05/24/2019, with the above CC.   Her periods were: regular periods every 28-30 days. Her last period started and ended early, lasted for 5 days. She has Positive signs or symptoms of nausea of pregnancy. She has Negative signs or symptoms of miscarriage or preterm labor She identifies Negative Zika risk factors for her and her partner On any different medications around the time she conceived/early pregnancy: No  History of varicella: Yes    Review of Systems  Constitutional: Negative.   HENT: Negative.   Eyes: Negative.   Respiratory: Negative for shortness of breath and wheezing.   Cardiovascular: Negative for chest pain and palpitations.  Gastrointestinal: Positive for nausea.  Genitourinary: Positive for frequency.  Musculoskeletal: Negative.   Skin: Negative.   Neurological: Positive for dizziness.  Endo/Heme/Allergies: Negative.   Psychiatric/Behavioral: Negative.   Breasts: positive for breast tenderness  ROS: Review of systems was otherwise negative, except as stated in the above HPI.  OBGYN History: As per HPI. OB History  Gravida Para Term Preterm AB Living  3 1 1   1 1   SAB TAB Ectopic Multiple Live Births    1     1    # Outcome Date GA Lbr Len/2nd Weight Sex Delivery Anes PTL Lv  3 Current           2 Term 05/24/10   7 lb 11 oz (3.487 kg) F Vag-Spont   LIV  1 TAB             Any issues with any prior pregnancies: no Any prior children are healthy, doing well, without any problems or issues: yes History of pap smears: Yes. Last pap smear 08/26/2018. NILM. History of STIs: No   Past Medical History: Past Medical History:  Diagnosis Date  . Abnormal Pap  smear of cervix   . History of Papanicolaou smear of cervix 05/10/2015; [id number]   neg; neg  . Immunization, viral disease    gardasil completed    Past Surgical History: Past Surgical History:  Procedure Laterality Date  . AUGMENTATION MAMMAPLASTY    . COLPOSCOPY  12/19/2010   lgsil    Family History:  Family History  Problem Relation Age of Onset  . Diabetes Mother        Type 2  . Atrial fibrillation Father   . Diabetes Maternal Grandmother   . Atrial fibrillation Sister   . Aneurysm Brother        brain - half brother  . Cancer Paternal Grandfather        lung   She denies any female cancers, bleeding or blood clotting disorders.  She denies any history of intellectual disability, birth defects or genetic disorders in her or the FOB's history  Social History:  Social History   Socioeconomic History  . Marital status: Single    Spouse name: Not on file  . Number of children: Not on file  . Years of education: 49  . Highest education level: Not on file  Occupational History  . 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IR STYLIST  Social Needs  . Financial resource strain: Not on file  . Food insecurity:    Worry: Not on file    Inability: Not on file  .  Transportation needs:    Medical: Not on file    Non-medical: Not on file  Tobacco Use  . Smoking status: Never Smoker  . Smokeless tobacco: Never Used  Substance and Sexual Activity  . Alcohol use: Yes    Comment: occ  . Drug use: No  . Sexual activity: Yes    Birth control/protection: Pill  Lifestyle  . Physical activity:    Days per week: Not on file    Minutes per session: Not on file  . Stress: Not on file  Relationships  . Social connections:    Talks on phone: Not on file    Gets together: Not on file    Attends religious service: Not on file    Active member of club or organization: Not on file    Attends meetings of clubs or organizations: Not on file    Relationship status: Not on file  . Intimate partner  violence:    Fear of current or ex partner: Not on file    Emotionally abused: Not on file    Physically abused: Not on file    Forced sexual activity: Not on file  Other Topics Concern  . Not on file  Social History Narrative  . Not on file   Any cats in the household: yes, she is avoiding contact with cat litter and feces Domestic violence screening negative.  Allergy: No Known Allergies  Current Outpatient Medications:  Current Outpatient Medications:  .  norgestimate-ethinyl estradiol (SPRINTEC 28) 0.25-35 MG-MCG tablet, Take 1 tablet by mouth daily. (Patient not taking: Reported on 10/09/2018), Disp: 3 Package, Rfl: 3   Physical Exam:   BP 120/80   Wt 168 lb (76.2 kg)   LMP 08/17/2018 (Approximate)   BMI 24.81 kg/m  Body mass index is 24.81 kg/m. Constitutional: Well nourished, well developed female in no acute distress.  Neck:  Supple, normal appearance, and no thyromegaly  Cardiovascular: S1, S2 normal, no murmur, rub or gallop, regular rate and rhythm Respiratory:  Clear to auscultation bilaterally. Normal respiratory effort Abdomen: No masses, hernias; diffusely non tender to palpation, non distended Breasts: patient declines to have breast exam. Neuro/Psych:  Normal mood and affect.  Skin:  Warm and dry.  Lymphatic:  No inguinal lymphadenopathy.   External genitalia, Bartholin's glands, Urethra, Skene's glands: within normal limits Vagina: within normal limits and with no blood in the vault  Uterus:  normal contour Adnexa:  no mass, fullness, tenderness  Assessment: Diana [name] is a 28 y.o. G3P1011 at 3530w4d based on Patient's last menstrual period on 08/17/2018 (approximate), with an Estimated Date of Delivery: 05/24/2019, presenting for prenatal care.  Plan:  1) Avoid alcoholic beverages. 2) Patient encouraged not to smoke.  3) Discontinue the use of all non-medicinal drugs and chemicals.  4) Take prenatal vitamins daily.  5) Seatbelt use advised. 6)  Nutrition, food safety (fish, cheese advisories, and high nitrite foods) and exercise discussed. 7) [facility] discussed  8) Patient is asked about travel to areas at risk for the Zika virus, and counseled to avoid travel and exposure to mosquitoes or people who may have themselves been exposed to the virus.   9) [facility] 10) Genetic Screening, such as with 1st Trimester Screening, cell free fetal DNA, AFP testing, and Ultrasound, is discussed with patient. She plans to have genetic testing this pregnancy. 11) Dating scan today showed a singleton IUP with size=dates, FHR 154. 12) Does not currently  desire medication for nausea.  Problem list reviewed and updated.  Marcelyn BruinsJacelyn , CNM [facility], [facility] 10/09/2018

## 2018-10-10 LAB — URINE DRUG PANEL 7
Amphetamines, Urine: NEGATIVE ng/mL
Barbiturate Quant, Ur: NEGATIVE ng/mL
Benzodiazepine Quant, Ur: NEGATIVE ng/mL
Cannabinoid Quant, Ur: NEGATIVE ng/mL
Cocaine (Metab.): NEGATIVE ng/mL
Opiate Quant, Ur: NEGATIVE ng/mL
PCP Quant, Ur: NEGATIVE ng/mL

## 2018-10-10 LAB — RPR+RH+ABO+RUB AB+AB SCR+CB...
Antibody Screen: NEGATIVE
HIV Screen 4th Generation wRfx: NONREACTIVE
Hematocrit: 37.7 % (ref 34.0–46.6)
Hemoglobin: 12.5 g/dL (ref 11.1–15.9)
Hepatitis B Surface Ag: NEGATIVE
MCH: 30.9 pg (ref 26.6–33.0)
MCHC: 33.2 g/dL (ref 31.5–35.7)
MCV: 93 fL (ref 79–97)
Platelets: 215 10*3/uL (ref 150–450)
RBC: 4.05 x10E6/uL (ref 3.77–5.28)
RDW: 12.1 % (ref 11.7–15.4)
RPR Ser Ql: NONREACTIVE
Rh Factor: POSITIVE
Rubella Antibodies, IGG: 1.4 index (ref >0.99)
Varicella zoster IgG: 533 index (ref >165)
WBC: 7.1 10*3/uL (ref 3.4–10.8)

## 2018-10-10 LAB — CERVICOVAGINAL ANCILLARY ONLY
Chlamydia: NEGATIVE
Neisseria Gonorrhea: NEGATIVE

## 2018-10-11 LAB — URINE CULTURE

## 2018-10-16 ENCOUNTER — Other Ambulatory Visit: Payer: Self-pay | Admitting: Maternal Newborn

## 2018-10-16 ENCOUNTER — Telehealth: Payer: Self-pay

## 2018-10-16 DIAGNOSIS — Z20828 Contact with and (suspected) exposure to other viral communicable diseases: Secondary | ICD-10-CM

## 2018-10-16 MED ORDER — OSELTAMIVIR PHOSPHATE 75 MG PO CAPS: 75.0000 mg | ORAL_CAPSULE | Freq: Every day | ORAL | 0 refills | Status: AC

## 2018-10-16 NOTE — Progress Notes (Signed)
Rx for Tamiflu, daughter has been diagnosed with influenza.

## 2018-10-16 NOTE — Telephone Encounter (Signed)
Per JYS will call in tamiflu.  Flu shot won't help b/c it takes it two weeks to be in effect.  Pt aware.

## 2018-10-16 NOTE — Telephone Encounter (Signed)
Pt's daughter has been dx'd c the flu.  Should pt get flu shot today or what to do.  She will be staying home c daughter tomorrow.  [phone]

## 2018-10-31 ENCOUNTER — Telehealth: Payer: Self-pay

## 2018-10-31 NOTE — Telephone Encounter (Signed)
Pt is starting to come down with a head cold.  What OTC meds can she take?  [phone]  Adv plain robitussin, plain sudafed, Hall's cough drops, warm salt water gargles, sucrets, e.s. tylenol, sip hot beverages/soups, push fluids and rest.

## 2018-11-04 ENCOUNTER — Encounter: Payer: Self-pay | Admitting: Obstetrics and Gynecology

## 2018-11-04 ENCOUNTER — Ambulatory Visit (INDEPENDENT_AMBULATORY_CARE_PROVIDER_SITE_OTHER): Payer: Medicaid Other | Admitting: Obstetrics and Gynecology

## 2018-11-04 VITALS — BP 122/76 | Wt 169.0 lb

## 2018-11-04 DIAGNOSIS — Z1379 Encounter for other screening for genetic and chromosomal anomalies: Secondary | ICD-10-CM

## 2018-11-04 DIAGNOSIS — Z3480 Encounter for supervision of other normal pregnancy, unspecified trimester: Secondary | ICD-10-CM

## 2018-11-04 DIAGNOSIS — Z3481 Encounter for supervision of other normal pregnancy, first trimester: Secondary | ICD-10-CM

## 2018-11-04 DIAGNOSIS — Z3A11 11 weeks gestation of pregnancy: Secondary | ICD-10-CM

## 2018-11-04 LAB — POCT URINALYSIS DIPSTICK OB
Glucose, UA: NEGATIVE
POC,PROTEIN,UA: NEGATIVE

## 2018-11-04 NOTE — Addendum Note (Signed)
Addended by: Liliane Shi on: 11/04/2018 08:53 AM   Modules accepted: Orders

## 2018-11-04 NOTE — Progress Notes (Signed)
No vb. No lof. Cold symptoms.

## 2018-11-04 NOTE — Progress Notes (Signed)
    Routine Prenatal Care Visit  Subjective  Diana [name] is a 28 y.o. G3P1011 at [redacted]w[redacted]d being seen today for ongoing prenatal care.  She is currently monitored for the following issues for this low-risk pregnancy and has Supervision of other normal pregnancy, antepartum on their problem list.  ----------------------------------------------------------------------------------- Patient reports sinus congestion.   Contractions: Not present. Vag. Bleeding: None.   . Denies leaking of fluid.  ----------------------------------------------------------------------------------- The following portions of the patient's history were reviewed and updated as appropriate: allergies, current medications, past family history, past medical history, past social history, past surgical history and problem list. Problem list updated.   Objective  Blood pressure 122/76, weight 169 lb (76.7 kg), last menstrual period 08/17/2018. Pregravid weight 160 lb (72.6 kg) Total Weight Gain 9 lb (4.082 kg) Urinalysis:      Fetal Status: Fetal Heart Rate (bpm): 171         General:  Alert, oriented and cooperative. Patient is in no acute distress.  Skin: Skin is warm and dry. No rash noted.   Cardiovascular: Normal heart rate noted  Respiratory: Normal respiratory effort, no problems with respiration noted  Abdomen: Soft, gravid, appropriate for gestational age. Pain/Pressure: Absent     Pelvic:  Cervical exam deferred        Extremities: Normal range of motion.     Mental Status: Normal mood and affect. Normal behavior. Normal judgment and thought content.     Assessment   28 y.o. G3P1011 at [redacted]w[redacted]d by  05/24/2019, by Last Menstrual Period presenting for routine prenatal visit  Plan   pregnancy3 Problems (from 08/17/18 to present)    Problem Noted Resolved   Supervision of other normal pregnancy, antepartum 10/09/2018 by Oswaldo Conroy, CNM No   Overview Addendum 10/10/2018 12:04 PM by Oswaldo Conroy,  CNM    Clinic [facility] Prenatal [facility]  Dating L=7 Blood type: A/Positive/-- (01/15 0931)   Genetic Screen 1 Screen:    AFP:     Quad:     NIPS: Antibody:Negative (01/15 0931)  Anatomic [place]  Rubella: 1.40 (01/15 0931) Varicella: Immune  GTT Early:               Third trimester:  RPR: Non Reactive (01/15 0931)   Rhogam N/A HBsAg: Negative (01/15 0931)   TDaP vaccine                       Flu Shot: HIV: Non Reactive (01/15 0931)   Baby Food                                GBS:   Contraception  Pap: 08/26/2018, NILM.  CBB     CS/VBAC    Support Person                  Gestational age appropriate obstetric precautions including but not limited to vaginal bleeding, contractions, leaking of fluid and fetal movement were reviewed in detail with the patient.    Maternit21 test today  Return in about 4 weeks (around 12/02/2018) for ROB.  Natale Milch MD [facility], [facility] 11/04/2018, 8:37 AM

## 2018-11-06 ENCOUNTER — Other Ambulatory Visit: Payer: Self-pay

## 2018-11-06 ENCOUNTER — Ambulatory Visit
Admission: EM | Admit: 2018-11-06 | Discharge: 2018-11-06 | Disposition: A | Discharge status: Home / Self Care | Payer: Medicaid Other | Attending: Family Medicine | Admitting: Family Medicine

## 2018-11-06 ENCOUNTER — Telehealth: Payer: Self-pay

## 2018-11-06 ENCOUNTER — Encounter: Payer: Self-pay | Admitting: Emergency Medicine

## 2018-11-06 DIAGNOSIS — H6691 Otitis media, unspecified, right ear: Secondary | ICD-10-CM

## 2018-11-06 MED ORDER — AMOXICILLIN 875 MG PO TABS: 875.0000 mg | ORAL_TABLET | Freq: Two times a day (BID) | ORAL | 0 refills | Status: DC

## 2018-11-06 NOTE — ED Triage Notes (Signed)
Patient states she woke up in the middle of the night with severe right ear pain. Patient is [redacted] weeks pregnant

## 2018-11-06 NOTE — Discharge Instructions (Addendum)
Take medication as prescribed. Rest. Drink plenty of fluids.  ° °Follow up with your primary care physician this week as needed. Return to Urgent care for new or worsening concerns.  ° °

## 2018-11-06 NOTE — ED Provider Notes (Signed)
[facility] ____________________________________________  Time seen: Approximately 8:49 AM  I have reviewed the triage vital signs and the nursing notes.   HISTORY  Chief Complaint Otalgia   HPI Diana [name] is a 28 y.o. female who is [redacted] weeks pregnant, presenting for evaluation of right ear pain.  Patient reports approximately 4 to 5 days of nasal congestion.  States that she felt like she was getting over the cold-like symptoms, but states that now the night she started having right ear pain that has persisted.  States right ear pain is currently moderate.  Did take Tylenol twice throughout the night which helped minimally.  Has not taken any other over-the-counter medications for the same complaints.  States right ear feels muffled.  Denies recurrent ear issues.  Has continued to eat and drink well.  Denies abdominal pain, vaginal bleeding or pregnancy complaints.  States other than nausea, pregnancy has been going well.  Reports otherwise doing well denies other complaints.  No recent antibiotic use.    Past Medical History:  Diagnosis Date  . Abnormal Pap smear of cervix   . History of Papanicolaou smear of cervix 05/10/2015; [id number]   neg; neg  . Immunization, viral disease    gardasil completed    Patient Active Problem List   Diagnosis Date Noted  . Supervision of other normal pregnancy, antepartum 10/09/2018    Past Surgical History:  Procedure Laterality Date  . AUGMENTATION MAMMAPLASTY    . COLPOSCOPY  12/19/2010   lgsil     No current facility-administered medications for this encounter.   Current Outpatient Medications:  .  amoxicillin (AMOXIL) 875 MG tablet, Take 1 tablet (875 mg total) by mouth 2 (two) times daily., Disp: 20 tablet, Rfl: 0 .  norgestimate-ethinyl estradiol (SPRINTEC 28) 0.25-35 MG-MCG tablet, Take 1 tablet by mouth daily. (Patient not taking: Reported on 10/09/2018), Disp: 3 Package, Rfl: 3  Allergies Patient has no  known allergies.  Family History  Problem Relation Age of Onset  . Diabetes Mother        Type 2  . Atrial fibrillation Father   . Diabetes Maternal Grandmother   . Atrial fibrillation Sister   . Aneurysm Brother        brain - half brother  . Cancer Paternal Grandfather        lung    Social History Social History   Tobacco Use  . Smoking status: Never Smoker  . Smokeless tobacco: Never Used  Substance Use Topics  . Alcohol use: Not Currently    Comment: occ  . Drug use: No    Review of Systems Constitutional: No fever ENT: No sore throat. Positive right ear pain.  Cardiovascular: Denies chest pain. Respiratory: Denies shortness of breath. Gastrointestinal: No abdominal pain.  Musculoskeletal: Negative for back pain. Skin: Negative for rash.   ____________________________________________   PHYSICAL EXAM:  VITAL SIGNS: ED Triage Vitals  Enc Vitals Group     BP 11/06/18 0832 114/70     Pulse Rate 11/06/18 0832 66     Resp 11/06/18 0832 18     Temp 11/06/18 0832 98.8 F (37.1 C)     Temp Source 11/06/18 0832 Oral     SpO2 11/06/18 0832 100 %     Weight 11/06/18 0830 169 lb (76.7 kg)     Height 11/06/18 0830 5\' 9"  (1.753 m)     Head Circumference --      Peak Flow --      Pain  Score 11/06/18 0829 8     Pain Loc --      Pain Edu? --      Excl. in GC? --     Constitutional: Alert and oriented. Well appearing and in no acute distress. Eyes: Conjunctivae are normal.  Head: Atraumatic. No sinus tenderness to palpation. No swelling. No erythema.  Ears: Left: nontener, normal canal, no erythema, mild effusion, otherwise normal TM.  Right: nontender, normal canal, moderate erythema and bulging TM.   No mastoid tenderness bilaterally.   Nose:Nasal congestion  Mouth/Throat: Mucous membranes are moist. No pharyngeal erythema. No tonsillar swelling or exudate.  Neck: No stridor.  No cervical spine tenderness to palpation. Hematological/Lymphatic/Immunilogical:  No cervical lymphadenopathy. Cardiovascular: Normal rate, regular rhythm. Grossly normal heart sounds.  Good peripheral circulation. Respiratory: Normal respiratory effort.  No retractions. No wheezes, rales or rhonchi. Good air movement.  Musculoskeletal: Ambulatory with steady gait.  Neurologic:  Normal speech and language. No gait instability. Skin:  Skin appears warm, dry and intact. No rash noted. Psychiatric: Mood and affect are normal. Speech and behavior are normal.  ___________________________________________   LABS (all labs ordered are listed, but only abnormal results are displayed)  Labs Reviewed - No data to display ____________________________________________   PROCEDURES Procedures    INITIAL IMPRESSION / ASSESSMENT AND PLAN / ED COURSE  Pertinent labs & imaging results that were available during my care of the patient were reviewed by me and considered in my medical decision making (see chart for details).  Well-appearing patient.  No acute distress.  Suspect viral respiratory infection secondary right otitis.  Will treat with oral amoxicillin.  Encourage supportive care.Discussed indication, risks and benefits of medications with patient.  Discussed follow up with Primary care physician this week. Discussed follow up and return parameters including no resolution or any worsening concerns. Patient verbalized understanding and agreed to plan.   ____________________________________________   FINAL CLINICAL IMPRESSION(S) / ED DIAGNOSES  Final diagnoses:  Right otitis media, unspecified otitis media type     ED Discharge Orders         Ordered    amoxicillin (AMOXIL) 875 MG tablet  2 times daily     11/06/18 0845           Note: This dictation was prepared with Dragon dictation along with smaller phrase technology. Any transcriptional errors that result from this process are unintentional.         Renford Dills, NP 11/06/18 [phone]

## 2018-11-06 NOTE — Telephone Encounter (Signed)
Pt has an ear inf.  Tylenol isn't helping much.  Is there anything else she can take or do for the ear pain?  [phone]  Pt was seen today and dx'd; given amoxicillin.  She has been taking e.s. tylenol.  Adv may take two e.s. tylenol q6h, warm compress or cold to area; plain robitussin, plain sudafed, sip hot beverages/soup, push fluids and rest.

## 2018-11-07 ENCOUNTER — Other Ambulatory Visit: Payer: Self-pay

## 2018-11-07 ENCOUNTER — Encounter: Payer: Self-pay | Admitting: Emergency Medicine

## 2018-11-07 ENCOUNTER — Ambulatory Visit
Admission: EM | Admit: 2018-11-07 | Discharge: 2018-11-07 | Disposition: A | Discharge status: Home / Self Care | Payer: Medicaid Other | Attending: Family Medicine | Admitting: Family Medicine

## 2018-11-07 DIAGNOSIS — H6981 Other specified disorders of Eustachian tube, right ear: Secondary | ICD-10-CM | POA: Diagnosis not present

## 2018-11-07 MED ORDER — FLUTICASONE PROPIONATE 50 MCG/ACT NA SUSP: 2.0000 | Freq: Every day | NASAL | 0 refills | Status: DC

## 2018-11-07 NOTE — ED Triage Notes (Signed)
Patient states she was diagnosed with an ear infection yesterday. Patient states the pain is better but she can't hear out of her ear and feels pressure.

## 2018-11-07 NOTE — ED Provider Notes (Addendum)
[facility]    CSN: [id number] Arrival date & time: 11/07/18  1424     History   Chief Complaint Chief Complaint  Patient presents with  . Ear Fullness    HPI Diana [name] is a 28 y.o. female.   HPI  -year-old female presents today after she was seen yesterday and diagnosed with a right ear infection.  States that the pain is better but she has a fullness in her ear and feels pressure  with decreased hearing.  Last night her pain was so severe and she thought about going to the emergency room.  It is much better.        Past Medical History:  Diagnosis Date  . Abnormal Pap smear of cervix   . History of Papanicolaou smear of cervix 05/10/2015; [id number]   neg; neg  . Immunization, viral disease    gardasil completed    Patient Active Problem List   Diagnosis Date Noted  . Supervision of other normal pregnancy, antepartum 10/09/2018    Past Surgical History:  Procedure Laterality Date  . AUGMENTATION MAMMAPLASTY    . COLPOSCOPY  12/19/2010   lgsil    OB History    Gravida  3   Para  1   Term  1   Preterm      AB  1   Living  1     SAB      TAB  1   Ectopic      Multiple      Live Births  1            Home Medications    Prior to Admission medications   Medication Sig Start Date End Date Taking? Authorizing Provider  amoxicillin (AMOXIL) 875 MG tablet Take 1 tablet (875 mg total) by mouth 2 (two) times daily. 11/06/18  Yes Renford DillsMiller, Lindsey, NP  fluticasone [facility](FLONASE) 50 MCG/ACT nasal spray Place 2 sprays into both nostrils daily. 11/07/18   Lutricia Feiloemer, Bryer Gottsch P, PA-C    Family History Family History  Problem Relation Age of Onset  . Diabetes Mother        Type 2  . Atrial fibrillation Father   . Diabetes Maternal Grandmother   . Atrial fibrillation Sister   . Aneurysm Brother        brain - half brother  . Cancer Paternal Grandfather        lung    Social History Social History   Tobacco Use  . Smoking  status: Never Smoker  . Smokeless tobacco: Never Used  Substance Use Topics  . Alcohol use: Not Currently    Comment: occ  . Drug use: No     Allergies   Patient has no known allergies.   Review of Systems Review of Systems  Constitutional: Positive for activity change. Negative for chills, fatigue and fever.  HENT: Positive for congestion and ear pain.   All other systems reviewed and are negative.    Physical Exam Triage Vital Signs ED Triage Vitals  Enc Vitals Group     BP 11/07/18 1459 118/83     Pulse Rate 11/07/18 1459 74     Resp 11/07/18 1459 18     Temp 11/07/18 1459 98.1 F (36.7 C)     Temp Source 11/07/18 1459 Oral     SpO2 11/07/18 1459 98 %     Weight 11/07/18 1458 169 lb (76.7 kg)     Height 11/07/18 1458 5\' 9"  (1.753 m)  Head Circumference --      Peak Flow --      Pain Score 11/07/18 1457 4     Pain Loc --      Pain Edu? --      Excl. in GC? --    No data found.  Updated Vital Signs BP 118/83 (BP Location: Right Arm)   Pulse 74   Temp 98.1 F (36.7 C) (Oral)   Resp 18   Ht 5\' 9"  (1.753 m)   Wt 169 lb (76.7 kg)   LMP 08/17/2018 (Approximate)   SpO2 98%   BMI 24.96 kg/m   Visual Acuity Right Eye Distance:   Left Eye Distance:   Bilateral Distance:    Right Eye Near:   Left Eye Near:    Bilateral Near:     Physical Exam Vitals signs and nursing note reviewed.  Constitutional:      General: She is not in acute distress.    Appearance: Normal appearance. She is normal weight. She is not ill-appearing, toxic-appearing or diaphoretic.  HENT:     Head: Normocephalic and atraumatic.     Left Ear: Tympanic membrane, ear canal and external ear normal.     Ears:     Comments: Right ear shows the TM to be erythematous with effusion present.    Nose: Nose normal.     Mouth/Throat:     Mouth: Mucous membranes are moist.  Eyes:     General:        Right eye: No discharge.        Left eye: No discharge.     Conjunctiva/sclera:  Conjunctivae normal.  Skin:    General: Skin is warm and dry.  Neurological:     General: No focal deficit present.     Mental Status: She is alert and oriented to person, place, and time.  Psychiatric:        Mood and Affect: Mood normal.        Behavior: Behavior normal.        Thought Content: Thought content normal.        Judgment: Judgment normal.      UC Treatments / Results  Labs (all labs ordered are listed, but only abnormal results are displayed) Labs Reviewed - No data to display  EKG None  Radiology No results found.  Procedures Procedures (including critical care time)  Medications Ordered in UC Medications - No data to display  Initial Impression / Assessment and Plan / UC Course  I have reviewed the triage vital signs and the nursing notes.  Pertinent labs & imaging results that were available during my care of the patient were reviewed by me and considered in my medical decision making (see chart for details).  She has eustachian tube dysfunction.  This was explained to her in detail.  Flonase nasal spray to her regimen.  If it is not improving or worsening she should follow-up with [facility].   Final Clinical Impressions(s) / UC Diagnoses   Final diagnoses:  Eustachian tube dysfunction, right   Discharge Instructions   None    ED Prescriptions    Medication Sig Dispense Auth. Provider   fluticasone (FLONASE) 50 MCG/ACT nasal spray Place 2 sprays into both nostrils daily. 16 g Lutricia Feil, PA-C     Controlled Substance Prescriptions [place] Controlled Substance Registry consulted? Not Applicable   Lutricia Feil, PA-C 11/07/18 1602    Lutricia Feil, PA-C 11/07/18 [phone]

## 2018-11-08 ENCOUNTER — Telehealth: Payer: Self-pay

## 2018-11-08 NOTE — Telephone Encounter (Signed)
Pt calling for genetic results.  Wants to know if gender can be placed in envelope and someone else p/u.  [phone]  Pt aware results not back yet - usually takes two weeks.  Gender can be put in envelope for someone else to p/u.

## 2018-11-09 LAB — MATERNIT 21 PLUS CORE, BLOOD
Chromosome 13: NEGATIVE
Chromosome 18: NEGATIVE
Chromosome 21: NEGATIVE
Fetal Fraction: 7
Y Chromosome: NOT DETECTED

## 2018-11-11 NOTE — Progress Notes (Signed)
Called and discussed result with patient.

## 2018-11-11 NOTE — Progress Notes (Signed)
Please make this patient a gender envelope. She will come pick it up. Thank you,  Dr. Jerene Pitch

## 2018-11-11 NOTE — Progress Notes (Signed)
Pt is aware and her friend stephanie will be picking it up

## 2018-11-13 ENCOUNTER — Telehealth: Payer: Self-pay

## 2018-11-13 NOTE — Telephone Encounter (Signed)
Diana [name] from [facility] calling today wanted to make sure it was okay for pt to be cleared to stay on flonase for up to 3 months and te be RX'ed prednisone 4 mg for fluid in ears. Please call back and advise [phone] ext [phone]

## 2018-11-14 NOTE — Telephone Encounter (Signed)
Spoke with [employer]. Okay with flonase. Recommended avoiding steroids until she is past 20 week duration because of cleft palate risks, but would be okay if steroids are necessary for maintaining maternal hearing, mom would need to be counseled about risks. Beth said they would avoid steroid usage at this time

## 2018-12-02 ENCOUNTER — Encounter: Payer: Self-pay | Admitting: Obstetrics and Gynecology

## 2018-12-02 ENCOUNTER — Ambulatory Visit (INDEPENDENT_AMBULATORY_CARE_PROVIDER_SITE_OTHER): Payer: Medicaid Other | Admitting: Obstetrics and Gynecology

## 2018-12-02 VITALS — BP 100/60 | Wt 178.0 lb

## 2018-12-02 DIAGNOSIS — Z3A15 15 weeks gestation of pregnancy: Secondary | ICD-10-CM

## 2018-12-02 DIAGNOSIS — Z3482 Encounter for supervision of other normal pregnancy, second trimester: Secondary | ICD-10-CM

## 2018-12-02 DIAGNOSIS — Z3480 Encounter for supervision of other normal pregnancy, unspecified trimester: Secondary | ICD-10-CM

## 2018-12-02 LAB — POCT URINALYSIS DIPSTICK OB
Glucose, UA: NEGATIVE
POC,PROTEIN,UA: NEGATIVE

## 2018-12-02 NOTE — Progress Notes (Signed)
    Routine Prenatal Care Visit  Subjective  Diana [name] is a 28 y.o. G3P1011 at [redacted]w[redacted]d being seen today for ongoing prenatal care.  She is currently monitored for the following issues for this low-risk pregnancy and has Supervision of other normal pregnancy, antepartum on their problem list.  ----------------------------------------------------------------------------------- Patient reports no complaints.   Contractions: Not present. Vag. Bleeding: None.  Movement: Absent. Denies leaking of fluid.  ----------------------------------------------------------------------------------- The following portions of the patient's history were reviewed and updated as appropriate: allergies, current medications, past family history, past medical history, past social history, past surgical history and problem list. Problem list updated.   Objective  Blood pressure 100/60, weight 178 lb (80.7 kg), last menstrual period 08/17/2018. Pregravid weight 160 lb (72.6 kg) Total Weight Gain 18 lb (8.165 kg) Urinalysis:      Fetal Status: Fetal Heart Rate (bpm): 153   Movement: Absent     General:  Alert, oriented and cooperative. Patient is in no acute distress.  Skin: Skin is warm and dry. No rash noted.   Cardiovascular: Normal heart rate noted  Respiratory: Normal respiratory effort, no problems with respiration noted  Abdomen: Soft, gravid, appropriate for gestational age. Pain/Pressure: Absent     Pelvic:  Cervical exam deferred        Extremities: Normal range of motion.     Mental Status: Normal mood and affect. Normal behavior. Normal judgment and thought content.     Assessment   28 y.o. G3P1011 at [redacted]w[redacted]d by  05/24/2019, by Last Menstrual Period presenting for routine prenatal visit  Plan   pregnancy3 Problems (from 08/17/18 to present)    Problem Noted Resolved   Supervision of other normal pregnancy, antepartum 10/09/2018 by Oswaldo Conroy, CNM No   Overview Addendum 10/10/2018  12:04 PM by Oswaldo Conroy, CNM    [facility]inic [facility] Prenatal Labs  Dating L=7 Blood type: A/Positive/-- (01/15 0931)   Genetic Screen 1 Screen:    AFP:     Quad:     NIPS: Antibody:Negative (01/15 0931)  Anatomic [place]  Rubella: 1.40 (01/15 0931) Varicella: Immune  GTT Early:               Third trimester:  RPR: Non Reactive (01/15 0931)   Rhogam N/A HBsAg: Negative (01/15 0931)   TDaP vaccine                       Flu Shot: HIV: Non Reactive (01/15 0931)   Baby Food                                GBS:   Contraception  Pap: 08/26/2018, NILM.  CBB     CS/VBAC    Support Person                  Gestational age appropriate obstetric precautions including but not limited to vaginal bleeding, contractions, leaking of fluid and fetal movement were reviewed in detail with the patient.    No issues, doing well.  Return in about 3 weeks (around 12/23/2018) for ROB.  Natale Milch MD [facility], [facility] 12/02/2018, 8:37 AM

## 2018-12-23 ENCOUNTER — Encounter: Payer: Medicaid Other | Admitting: Obstetrics and Gynecology

## 2019-01-06 ENCOUNTER — Other Ambulatory Visit: Payer: Self-pay

## 2019-01-06 ENCOUNTER — Ambulatory Visit (INDEPENDENT_AMBULATORY_CARE_PROVIDER_SITE_OTHER): Payer: Medicaid Other

## 2019-01-06 ENCOUNTER — Ambulatory Visit (INDEPENDENT_AMBULATORY_CARE_PROVIDER_SITE_OTHER): Payer: Medicaid Other | Admitting: Obstetrics and Gynecology

## 2019-01-06 ENCOUNTER — Encounter: Payer: Self-pay | Admitting: Obstetrics and Gynecology

## 2019-01-06 ENCOUNTER — Ambulatory Visit: Payer: Medicaid Other

## 2019-01-06 VITALS — BP 120/60 | Wt 186.0 lb

## 2019-01-06 DIAGNOSIS — Z3482 Encounter for supervision of other normal pregnancy, second trimester: Secondary | ICD-10-CM

## 2019-01-06 DIAGNOSIS — Z3A20 20 weeks gestation of pregnancy: Secondary | ICD-10-CM

## 2019-01-06 DIAGNOSIS — Z363 Encounter for antenatal screening for malformations: Secondary | ICD-10-CM

## 2019-01-06 DIAGNOSIS — Z3480 Encounter for supervision of other normal pregnancy, unspecified trimester: Secondary | ICD-10-CM

## 2019-01-06 NOTE — Progress Notes (Signed)
ROB/US C/o not feeling baby as much on the outside of uterus

## 2019-01-06 NOTE — Patient Instructions (Addendum)
 Hello,  Given the current COVID-19 pandemic, our practice is making changes in how we are providing care to our patients. We are limiting in-person visits for the safety of all of our patients.   As a practice, we have met to discuss the best way to minimize visits, but still provide excellent care to our expecting mothers.  We have decided on the following visit structure for low-risk pregnancies.  Initial Pregnancy visit will be conducted as a telephone or web visit.  Between 10-14 weeks  there will be one in-person visit for an ultrasound, lab work, and genetic screening. 20 weeks in-person visit with an anatomy ultrasound  28 weeks in-person office visit for a 1-hour glucose test and a TDAP vaccination 32 weeks in-person office visit 34 weeks telephone visit 36 weeks in-person office visit for GBS, chlamydia, and gonorrhea testing 38 weeks in-person office visit 40 weeks in-person office visit  Understandably, some patients will require more visits than what is outlined above. Additional visits will be determined on a case-by-case basis.   We will, as always, be available for emergencies or to address concerns that might arise between in-person visits. We ask that you allow us the opportunity to address any concerns over the phone or through a virtual visit first. We will be available to return your phone calls throughout the day.   If you are able to purchase a scale, a blood pressure machine, and a home fetal doppler visits could be limited further. This will help decrease your exposure risks, but these purchases are not a necessity.   Things seem to change daily and there is the possibility that this structure could change, please be patient as we adapt to a new way of caring for patients.   Thank you for trusting us with your prenatal care. Our practice values you and looks forward to providing you with excellent care.   Sincerely,   [facility], [facility]     COVID-19 and Your Pregnancy FAQ  How can I prevent infection with COVID-19 during my pregnancy? Social distancing is key. Please limit any interactions in public. Try and work from home if possible. Frequently wash your hands after touching possibly contaminated surfaces. Avoid touching your face.  Minimize trips to the store. Consider online ordering when possible.   Should I wear a mask? YES. It is recommended by the CDC that all people wear a cloth mask or facial covering in public. This will help reduce transmission as well as your risk or acquiring COVID-19. New studies are showing that even asymptomatic individuals can spread the virus from talking.   What are the symptoms of COVID-19? Fever (greater than 100.4 F), dry cough, shortness of breath.  Am I more at risk for COVID-19 since I am pregnant? There is not currently data showing that pregnant women are more adversely impacted by COVID-19 than the general population. However, we know that pregnant women tend to have worse respiratory complications from similar diseases such as the flu and SARS and for this reason should be considered an at-risk population.  What do I do if I am experiencing the symptoms of COVID-19? Testing is being limited because of test availability. If you are experiencing symptoms you should quarantine yourself, and the members of your family, for at least 2 weeks at home.   Please visit this website for more information: https://www.cdc.gov/coronavirus/2019-ncov/if-you-are-sick/steps-when-sick.html  When should I go to the Emergency Room? Please go to the emergency room if you   are experiencing ANY of these symptoms*:  1.    Difficulty breathing or shortness of breath 2.    Persistent pain or pressure in the chest 3.    Confusion or difficulty being aroused (or awakened) 4.    Bluish lips or face  *This list is not all inclusive. Please consult our office for any other symptoms that are severe or  concerning.  What do I do if I am having difficulty breathing? You should go to the Emergency Room for evaluation. At this time they have a tent set up for evaluating patients with COVID-19 symptoms.   How will my prenatal care be different because of the COVID-19 pandemic? It has been recommended to reduce the frequency of face-to-face visits and use resources such as telephone and virtual visits when possible. Using a scale, blood pressure machine and fetal doppler at home can further help reduce face-to-face visits. You will be provided with additional information on this topic.  We ask that you come to your visits alone to minimize potential exposures to  COVID-19.  How can I receive childbirth education? At this time in-person classes have been cancelled. You can register for online childbirth education, breastfeeding, and newborn care classes.  Please visit:  [email or web address] for more information  How will my hospital birth experience be different? The hospital is currently limiting visitors. This means that while you are in labor you can only have one person at the hospital with you. Additional family members will not be allowed to wait in the building or outside your room. Your one support person can be the father of the baby, a relative, a doula, or a friend. Once one support person is designated that person will wear a band. This band cannot be shared with multiple people.  Nitrous Gas is not being offered for pain relief since the tubing and filter for the machine can not be sanitized in a way to guarantee prevention of transmission of COVID-19.  Nasal cannula use of oxygen for fetal indications has also been discontinued.  Currently a clear plastic sheet is being hung between mom and the delivering provider during pushing and delivery to help prevent transmission of COVID-19.      How long will I stay in the hospital for after giving birth? It is also recommended that  discharge home be expedited during the COVID-19 outbreak. This means staying for 1 day after a vaginal delivery and 2 days after a cesarean section. Patients who need to stay longer for medical reasons are allowed to do so, but the goal will be for expedited discharge home.   What if I have COVID-19 and I am in labor? We ask that you wear a mask while on labor and delivery. We will try and accommodate you being placed in a room that is capable of filtering the air. Please call ahead if you are in labor and on your way to the hospital. The phone number for labor and delivery at [facility] is [phone].  If I have COVID-19 when my baby is born how can I prevent my baby from contracting COVID-19? This is an issue that will have to be discussed on a case-by-case basis. Current recommendations suggest providing separate isolation rooms for both the mother and new infant as well as limiting visitors. However, there are practical challenges to this recommendation. The situation will assuredly change and decisions will be influenced by the desires of the mother and availability of space.    Some suggestions are the use of a curtain or physical barrier between mom and infant, hand hygiene, mom wearing a mask, or 6 feet of spacing between a mom and infant.   Can I breastfeed during the COVID-19 pandemic?   Yes, breastfeeding is encouraged.  Can I breastfeed if I have COVID-19? Yes. Covid-19 has not been found in breast milk. This means you cannot give COVID-19 to your child through breast milk. Breast feeding will also help pass antibodies to fight infection to your baby.   What precautions should I take when breastfeeding if I have COVID-19? If a mother and newborn do room-in and the mother wishes to feed at the breast, she should put on a facemask and practice hand hygiene before each feeding.  What precautions should I take when pumping if I have COVID-19? Prior to expressing  breast milk, mothers should practice hand hygiene. After each pumping session, all parts that come into contact with breast milk should be thoroughly washed and the entire pump should be appropriately disinfected per the manufacturer's instructions. This expressed breast milk should be fed to the newborn by a healthy caregiver.  What if I am pregnant and work in healthcare? Based on limited data regarding COVID-19 and pregnancy, ACOG currently does not propose creating additional restrictions on pregnant health care personnel because of COVID-19 alone. Pregnant women do not appear to be at higher risk of severe disease related to COVID-19. Pregnant health care personnel should follow CDC risk assessment and infection control guidelines for health care personnel exposed to patients with suspected or confirmed COVID-19. Adherence to recommended infection prevention and control practices is an important part of protecting all health care personnel in health care settings.    Information on COVID-19 in pregnancy is very limited; however, facilities may want to consider limiting exposure of pregnant health care personnel to patients with confirmed or suspected COVID-19 infection, especially during higher-risk procedures (eg, aerosol-generating procedures), if feasible, based on staffing availability.    

## 2019-01-06 NOTE — Progress Notes (Signed)
    Routine Prenatal Care Visit  Subjective  Diana [name] is a 28 y.o. G3P1011 at [redacted]w[redacted]d being seen today for ongoing prenatal care.  She is currently monitored for the following issues for this low-risk pregnancy and has Supervision of other normal pregnancy, antepartum on their problem list.  ----------------------------------------------------------------------------------- Patient reports no complaints.   Contractions: Not present. Vag. Bleeding: None.  Movement: Present. Denies leaking of fluid.  ----------------------------------------------------------------------------------- The following portions of the patient's history were reviewed and updated as appropriate: allergies, current medications, past family history, past medical history, past social history, past surgical history and problem list. Problem list updated.   Objective  Blood pressure 120/60, weight 186 lb (84.4 kg), last menstrual period 08/17/2018. Pregravid weight 160 lb (72.6 kg) Total Weight Gain 26 lb (11.8 kg) Urinalysis:      Fetal Status: Fetal Heart Rate (bpm): 170   Movement: Present     General:  Alert, oriented and cooperative. Patient is in no acute distress.  Skin: Skin is warm and dry. No rash noted.   Cardiovascular: Normal heart rate noted  Respiratory: Normal respiratory effort, no problems with respiration noted  Abdomen: Soft, gravid, appropriate for gestational age. Pain/Pressure: Absent     Pelvic:  Cervical exam deferred        Extremities: Normal range of motion.  Edema: None  Mental Status: Normal mood and affect. Normal behavior. Normal judgment and thought content.     Assessment   28 y.o. G3P1011 at [redacted]w[redacted]d by  05/24/2019, by Last Menstrual Period presenting for routine prenatal visit  Plan   pregnancy3 Problems (from 08/17/18 to present)    Problem Noted Resolved   Supervision of other normal pregnancy, antepartum 10/09/2018 by Oswaldo Conroy, CNM No   Overview Addendum  01/06/2019 10:48 AM by Natale Milch, MD    [facility]inic [facility] Prenatal Labs  Dating L=7 Blood type: A/Positive/-- (01/15 0931)   Genetic Screen  NIPS: normal xx Antibody:Negative (01/15 0931)  Anatomic [place] complete Rubella: 1.40 (01/15 0931) Varicella: Immune  GTT Early:               Third trimester:  RPR: Non Reactive (01/15 0931)   Rhogam N/A HBsAg: Negative (01/15 0931)   TDaP vaccine                       Flu Shot: HIV: Non Reactive (01/15 0931)   Baby Food                                GBS:   Contraception  Pap: 08/26/2018, NILM.  CBB     CS/VBAC    Support Person                  Gestational age appropriate obstetric precautions including but not limited to vaginal bleeding, contractions, leaking of fluid and fetal movement were reviewed in detail with the patient.    Discussed COVID-19 modified prenatal care Anatomy US WNL today  Return in about 4 weeks (around 02/03/2019) for telephone visit.  Natale Milch MD [facility], [facility] 01/06/2019, 11:13 AM

## 2019-02-03 ENCOUNTER — Ambulatory Visit (INDEPENDENT_AMBULATORY_CARE_PROVIDER_SITE_OTHER): Payer: Medicaid Other | Admitting: Obstetrics & Gynecology

## 2019-02-03 ENCOUNTER — Other Ambulatory Visit: Payer: Self-pay

## 2019-02-03 ENCOUNTER — Encounter: Payer: Self-pay | Admitting: Obstetrics & Gynecology

## 2019-02-03 DIAGNOSIS — Z3482 Encounter for supervision of other normal pregnancy, second trimester: Secondary | ICD-10-CM | POA: Diagnosis not present

## 2019-02-03 DIAGNOSIS — Z3A24 24 weeks gestation of pregnancy: Secondary | ICD-10-CM | POA: Diagnosis not present

## 2019-02-03 DIAGNOSIS — Z131 Encounter for screening for diabetes mellitus: Secondary | ICD-10-CM

## 2019-02-03 DIAGNOSIS — Z3480 Encounter for supervision of other normal pregnancy, unspecified trimester: Secondary | ICD-10-CM

## 2019-02-03 NOTE — Progress Notes (Signed)
Virtual Visit via Telephone Note  I connected with patient on 02/03/19 at  8:50 AM EDT by telephone and verified that I am speaking with the correct person using two identifiers.   I discussed the limitations, risks, security and privacy concerns of performing an evaluation and management service by telephone and the availability of in person appointments. I also discussed with the patient that there may be a patient responsible charge related to this service. The patient expressed understanding and agreed to proceed.  The patient was at Home I spoke with the patient from my office  Diana [name] is a 28 y.o. G3P1011 at [redacted]w[redacted]d being seen today for ongoing prenatal care.  She is currently monitored for the following issues for this low-risk pregnancy and has Supervision of other normal pregnancy, antepartum on their problem list.  ----------------------------------------------------------------------------------- Patient reports no complaints.   Denies pain, VB, leaking of fluid.  ----------------------------------------------------------------------------------- The following portions of the patient's history were reviewed and updated as appropriate: allergies, current medications, past family history, past medical history, past social history, past surgical history and problem list. Problem list updated.   Objective  Last menstrual period 08/17/2018. Pregravid weight 160 lb (72.6 kg) Total Weight Gain 26 lb (11.8 kg)  Physical Exam could not be performed. Because of the COVID-19 outbreak this visit was performed over the phone and not in person.   Assessment   28 y.o. G3P1011 at [redacted]w[redacted]d by  05/24/2019, by Last Menstrual Period presenting for routine prenatal visit  Plan   pregnancy3 Problems (from 08/17/18 to present)    Problem Noted Resolved   Supervision of other normal pregnancy, antepartum 10/09/2018 by Oswaldo Conroy, CNM No   Overview Addendum 02/03/2019  9:12 AM by Nadara Mustard, MD    [facility] Prenatal Labs  Dating L=7 Blood type: A/Positive/-- (01/15 0931)   Genetic Screen  NIPS: normal xx Antibody:Negative (01/15 0931)  Anatomic [place] complete Rubella: 1.40 (01/15 0931) Varicella: Immune  GTT  Third trimester:  RPR: Non Reactive (01/15 0931)   Rhogam N/A HBsAg: Negative (01/15 0931)   TDaP vaccine           Flu Shot: HIV: Non Reactive (01/15 0931)   Baby Food    Breast                               GBS:   Contraception    Progesterone pill Pap: 08/26/2018, NILM.  CBB     No   CS/VBAC    N/A   Support Person              Gestational age appropriate obstetric precautions including but not limited to vaginal bleeding, contractions, leaking of fluid and fetal movement were reviewed in detail with the patient.     Follow Up Instructions: 4 weeks w labs   I discussed the assessment and treatment plan with the patient. The patient was provided an opportunity to ask questions and all were answered. The patient agreed with the plan and demonstrated an understanding of the instructions.   The patient was advised to call back or seek an in-person evaluation if the symptoms worsen or if the condition fails to improve as anticipated.  I provided 15 minutes of non-face-to-face time during this encounter.  Return in about 4 weeks (around 03/03/2019) for ROB w GLucola in office.  Annamarie Major, MD [facility], [facility] 02/03/2019 9:14 AM

## 2019-03-03 ENCOUNTER — Ambulatory Visit (INDEPENDENT_AMBULATORY_CARE_PROVIDER_SITE_OTHER): Payer: Medicaid Other | Admitting: Obstetrics and Gynecology

## 2019-03-03 ENCOUNTER — Other Ambulatory Visit: Payer: Medicaid Other

## 2019-03-03 ENCOUNTER — Other Ambulatory Visit: Payer: Self-pay

## 2019-03-03 ENCOUNTER — Encounter: Payer: Self-pay | Admitting: Obstetrics and Gynecology

## 2019-03-03 VITALS — Wt 200.0 lb

## 2019-03-03 DIAGNOSIS — Z3A28 28 weeks gestation of pregnancy: Secondary | ICD-10-CM

## 2019-03-03 DIAGNOSIS — Z131 Encounter for screening for diabetes mellitus: Secondary | ICD-10-CM

## 2019-03-03 DIAGNOSIS — Z3483 Encounter for supervision of other normal pregnancy, third trimester: Secondary | ICD-10-CM

## 2019-03-03 DIAGNOSIS — Z3480 Encounter for supervision of other normal pregnancy, unspecified trimester: Secondary | ICD-10-CM

## 2019-03-03 NOTE — Progress Notes (Signed)
  Routine Prenatal Care Visit  Subjective  Diana [name] is a 28 y.o. G3P1011 at [redacted]w[redacted]d being seen today for ongoing prenatal care.  She is currently monitored for the following issues for this low-risk pregnancy and has Supervision of other normal pregnancy, antepartum on their problem list.  ----------------------------------------------------------------------------------- Patient reports no complaints.   Contractions: Not present. Vag. Bleeding: None.  Movement: Present. Denies leaking of fluid.  ----------------------------------------------------------------------------------- The following portions of the patient's history were reviewed and updated as appropriate: allergies, current medications, past family history, past medical history, past social history, past surgical history and problem list. Problem list updated.   Objective  Weight 200 lb (90.7 kg), last menstrual period 08/17/2018. Pregravid weight 160 lb (72.6 kg) Total Weight Gain 40 lb (18.1 kg) Urinalysis: Urine Protein    Urine Glucose    Fetal Status: Fetal Heart Rate (bpm): 145 Fundal Height: 29 cm Movement: Present     General:  Alert, oriented and cooperative. Patient is in no acute distress.  Skin: Skin is warm and dry. No rash noted.   Cardiovascular: Normal heart rate noted  Respiratory: Normal respiratory effort, no problems with respiration noted  Abdomen: Soft, gravid, appropriate for gestational age. Pain/Pressure: Absent     Pelvic:  Cervical exam deferred        Extremities: Normal range of motion.  Edema: Trace  Mental Status: Normal mood and affect. Normal behavior. Normal judgment and thought content.   Assessment   28 y.o. G3P1011 at [redacted]w[redacted]d by  05/24/2019, by Last Menstrual Period presenting for routine prenatal visit  Plan   pregnancy3 Problems (from 08/17/18 to present)    Problem Noted Resolved   Supervision of other normal pregnancy, antepartum 10/09/2018 by Rexene Agent, CNM No   Overview Addendum 02/03/2019  9:12 AM by Gae Dry, MD    [facility] Prenatal Labs  Dating L=7 Blood type: A/Positive/-- (01/15 0931)   Genetic Screen  NIPS: normal xx Antibody:Negative (01/15 0931)  Anatomic [place] complete Rubella: 1.40 (01/15 0931) Varicella: Immune  GTT  Third trimester:  RPR: Non Reactive (01/15 0931)   Rhogam N/A HBsAg: Negative (01/15 0931)   TDaP vaccine           Flu Shot: HIV: Non Reactive (01/15 0931)   Baby Food    Breast                               GBS:   Contraception    Progesterone pill Pap: 08/26/2018, NILM.  CBB     No   CS/VBAC    N/A   Support Person              Preterm labor symptoms and general obstetric precautions including but not limited to vaginal bleeding, contractions, leaking of fluid and fetal movement were reviewed in detail with the patient. Please refer to After Visit Summary for other counseling recommendations.   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  Prentice Docker, MD, [facility], [facility] 03/03/2019 10:54 AM

## 2019-03-04 LAB — 28 WEEK RH+PANEL
Basophils Absolute: 0 10*3/uL (ref 0.0–0.2)
Basos: 0 %
EOS (ABSOLUTE): 0.1 10*3/uL (ref 0.0–0.4)
Eos: 1 %
Gestational Diabetes Screen: 91 mg/dL (ref 65–139)
HIV Screen 4th Generation wRfx: NONREACTIVE
Hematocrit: 35.1 % (ref 34.0–46.6)
Hemoglobin: 11.9 g/dL (ref 11.1–15.9)
Immature Grans (Abs): 0.1 10*3/uL (ref 0.0–0.1)
Immature Granulocytes: 1 %
Lymphocytes Absolute: 1.8 10*3/uL (ref 0.7–3.1)
Lymphs: 20 %
MCH: 32 pg (ref 26.6–33.0)
MCHC: 33.9 g/dL (ref 31.5–35.7)
MCV: 94 fL (ref 79–97)
Monocytes Absolute: 0.5 10*3/uL (ref 0.1–0.9)
Monocytes: 5 %
Neutrophils Absolute: 6.6 10*3/uL (ref 1.4–7.0)
Neutrophils: 73 %
Platelets: 219 10*3/uL (ref 150–450)
RBC: 3.72 x10E6/uL — ABNORMAL LOW (ref 3.77–5.28)
RDW: 12.2 % (ref 11.7–15.4)
RPR Ser Ql: NONREACTIVE
WBC: 9 10*3/uL (ref 3.4–10.8)

## 2019-03-10 ENCOUNTER — Encounter: Payer: Self-pay | Admitting: Obstetrics & Gynecology

## 2019-03-18 ENCOUNTER — Other Ambulatory Visit: Payer: Self-pay

## 2019-03-18 ENCOUNTER — Ambulatory Visit (INDEPENDENT_AMBULATORY_CARE_PROVIDER_SITE_OTHER): Payer: Medicaid Other | Admitting: Obstetrics and Gynecology

## 2019-03-18 ENCOUNTER — Encounter: Payer: Self-pay | Admitting: Obstetrics and Gynecology

## 2019-03-18 DIAGNOSIS — O26893 Other specified pregnancy related conditions, third trimester: Secondary | ICD-10-CM | POA: Diagnosis not present

## 2019-03-18 DIAGNOSIS — K219 Gastro-esophageal reflux disease without esophagitis: Secondary | ICD-10-CM | POA: Diagnosis not present

## 2019-03-18 DIAGNOSIS — Z3480 Encounter for supervision of other normal pregnancy, unspecified trimester: Secondary | ICD-10-CM

## 2019-03-18 MED ORDER — SUCRALFATE 1 G PO TABS: 1.0000 g | ORAL_TABLET | Freq: Three times a day (TID) | ORAL | 6 refills | Status: DC

## 2019-03-18 NOTE — Progress Notes (Signed)
Routine Prenatal Care Visit- Virtual Visit  Subjective   Virtual Visit via Telephone Note  I connected with Diana Brunswicknna M Yousuf on 03/18/19 at  8:10 AM EDT by telephone and verified that I am speaking with the correct person using two identifiers.   I discussed the limitations, risks, security and privacy concerns of performing an evaluation and management service by telephone and the availability of in person appointments. I also discussed with the patient that there may be a patient responsible charge related to this service. The patient expressed understanding and agreed to proceed.  The patient was at home I spoke with the patient from my  office The names of people involved in this encounter were: Adelise and Dr. Jerene PitchSchuman.   Diana [name] is a 28 y.o. G3P1011 at 5111w3d being seen today for ongoing prenatal care.  She is currently monitored for the following issues for this low-risk pregnancy and has Supervision of other normal pregnancy, antepartum on their problem list.  ----------------------------------------------------------------------------------- Patient reports pelvic pain and heart burn.  Pain on right side if she sits for too long. Tightening of her abdomen when she is active and walking. Pain usually resolves with rest.   Contractions: Irregular. Vag. Bleeding: None.  Movement: Present. Denies leaking of fluid.  ----------------------------------------------------------------------------------- The following portions of the patient's history were reviewed and updated as appropriate: allergies, current medications, past family history, past medical history, past social history, past surgical history and problem list. Problem list updated.   Objective  Last menstrual period 08/17/2018. Pregravid weight 160 lb (72.6 kg) Total Weight Gain 40 lb (18.1 kg) Urinalysis:      Fetal Status:     Movement: Present     Physical Exam could not be performed. Because of the  COVID-19 outbreak this visit was performed over the phone and not in person.   Assessment   28 y.o. G3P1011 at 3411w3d by  05/24/2019, by Last Menstrual Period presenting for routine prenatal visit  Plan   pregnancy3 Problems (from 08/17/18 to present)    Problem Noted Resolved   Supervision of other normal pregnancy, antepartum 10/09/2018 by Oswaldo ConroySchmid, Jacelyn Y, CNM No   Overview Addendum 02/03/2019  9:12 AM by Nadara MustardHarris, Robert P, MD    [facility] Prenatal Labs  Dating L=7 Blood type: A/Positive/-- (01/15 0931)   Genetic Screen  NIPS: normal xx Antibody:Negative (01/15 0931)  Anatomic US complete Rubella: 1.40 (01/15 0931) Varicella: Immune  GTT  Third trimester: 91 RPR: Non Reactive (01/15 0931)   Rhogam N/A HBsAg: Negative (01/15 0931)   TDaP vaccine           Flu Shot: HIV: Non Reactive (01/15 0931)   Baby Food    Breast                               GBS:   Contraception    Progesterone pill Pap: 08/26/2018, NILM.  CBB     No   CS/VBAC    N/A   Support Person              Gestational age appropriate obstetric precautions including but not limited to vaginal bleeding, contractions, leaking of fluid and fetal movement were reviewed in detail with the patient.    Rx for carafate sent to pharmacy.   Follow Up Instructions: 2 weeks- ROB visit, will need TDAP   I discussed the assessment and treatment plan with the patient.  The patient was provided an opportunity to ask questions and all were answered. The patient agreed with the plan and demonstrated an understanding of the instructions.   The patient was advised to call back or seek an in-person evaluation if the symptoms worsen or if the condition fails to improve as anticipated.  I provided 12 minutes of non-face-to-face time during this encounter.  No follow-ups on file.  Adrian Prows MD [facility], [facility] 03/18/2019 8:22 AM

## 2019-03-18 NOTE — Patient Instructions (Signed)

## 2019-03-18 NOTE — Progress Notes (Signed)
ROB telephone C/o braxton hicks, pelvic pain and back pain Denies lof, no vb, Good FM

## 2019-04-01 ENCOUNTER — Other Ambulatory Visit: Payer: Self-pay

## 2019-04-01 ENCOUNTER — Encounter: Payer: Self-pay | Admitting: Maternal Newborn

## 2019-04-01 ENCOUNTER — Ambulatory Visit (INDEPENDENT_AMBULATORY_CARE_PROVIDER_SITE_OTHER): Payer: Medicaid Other | Admitting: Maternal Newborn

## 2019-04-01 VITALS — BP 110/60 | Wt 207.4 lb

## 2019-04-01 DIAGNOSIS — Z3689 Encounter for other specified antenatal screening: Secondary | ICD-10-CM

## 2019-04-01 DIAGNOSIS — Z3480 Encounter for supervision of other normal pregnancy, unspecified trimester: Secondary | ICD-10-CM

## 2019-04-01 DIAGNOSIS — Z3A32 32 weeks gestation of pregnancy: Secondary | ICD-10-CM

## 2019-04-01 DIAGNOSIS — Z3483 Encounter for supervision of other normal pregnancy, third trimester: Secondary | ICD-10-CM

## 2019-04-01 DIAGNOSIS — Z23 Encounter for immunization: Secondary | ICD-10-CM

## 2019-04-01 LAB — POCT URINALYSIS DIPSTICK OB
Glucose, UA: NEGATIVE
POC,PROTEIN,UA: NEGATIVE

## 2019-04-01 NOTE — Progress Notes (Signed)
    Routine Prenatal Care Visit  Subjective  Diana [name] is a 28 y.o. G3P1011 at [redacted]w[redacted]d being seen today for ongoing prenatal care.  She is currently monitored for the following issues for this low-risk pregnancy and has Supervision of other normal pregnancy, antepartum on their problem list.  ----------------------------------------------------------------------------------- Patient reports pain in ribs and upper part of leg on the right side. Feels like it is related to baby's position. Contractions: Not present. Vag. Bleeding: None.  Movement: Present. No leaking of fluid.  ----------------------------------------------------------------------------------- The following portions of the patient's history were reviewed and updated as appropriate: allergies, current medications, past family history, past medical history, past social history, past surgical history and problem list. Problem list updated.   Objective  Blood pressure 110/60, weight 207 lb 6.4 oz (94.1 kg), last menstrual period 08/17/2018. Pregravid weight 160 lb (72.6 kg) Total Weight Gain 47 lb 6.4 oz (21.5 kg) Urinalysis: Urine dipstick shows negative for glucose, protein.  Fetal Status: Fetal Heart Rate (bpm): 144 Fundal Height: 36 cm Movement: Present     General:  Alert, oriented and cooperative. Patient is in no acute distress.  Skin: Skin is warm and dry. No rash noted.   Cardiovascular: Normal heart rate noted  Respiratory: Normal respiratory effort, no problems with respiration noted  Abdomen: Soft, gravid, appropriate for gestational age. Pain/Pressure: Present     Pelvic:  Cervical exam deferred        Extremities: Normal range of motion.     Mental Status: Normal mood and affect. Normal behavior. Normal judgment and thought content.     Assessment   28 y.o. [id number] at [redacted]w[redacted]d, [place] 05/24/2019 by Last Menstrual Period presenting for a routine prenatal visit.  Plan   pregnancy3 Problems (from 08/17/18 to  present)    Problem Noted Resolved   Supervision of other normal pregnancy, antepartum 10/09/2018 by Rexene Agent, CNM No   Overview Addendum 03/18/2019  8:22 AM by Homero Fellers, MD    [facility]inic [facility] Prenatal Labs  Dating L=7 Blood type: A/Positive/-- (01/15 0931)   Genetic Screen  NIPS: normal xx Antibody:Negative (01/15 0931)  Anatomic [place] complete Rubella: 1.40 (01/15 0931) Varicella: Immune  GTT  Third trimester: 91 RPR: Non Reactive (01/15 0931)   Rhogam N/A HBsAg: Negative (01/15 0931)   TDaP vaccine           Flu Shot: HIV: Non Reactive (01/15 0931)   Baby Food    Breast                               GBS:   Contraception    Progesterone pill Pap: 08/26/2018, NILM.  CBB     No   CS/VBAC    N/A   Support Person           TDaP vaccine accepted today.  Discussed growth scan next visit for fundal height greater than dates.  Please refer to After Visit Summary for other counseling recommendations.   Return in about 2 weeks (around 04/15/2019) for ROB and ultrasound.  Avel Sensor, CNM 04/01/2019  10:15 AM

## 2019-04-01 NOTE — Patient Instructions (Signed)
Third Trimester of Pregnancy The third trimester is from week 28 through week 40 (months 7 through 9). The third trimester is a time when the unborn baby (fetus) is growing rapidly. At the end of the ninth month, the fetus is about 20 inches in length and weighs 6-10 pounds. Body changes during your third trimester Your body will continue to go through many changes during pregnancy. The changes vary from woman to woman. During the third trimester:  Your weight will continue to increase. You can expect to gain 25-35 pounds (11-16 kg) by the end of the pregnancy.  You may begin to get stretch marks on your hips, abdomen, and breasts.  You may urinate more often because the fetus is moving lower into your pelvis and pressing on your bladder.  You may develop or continue to have heartburn. This is caused by increased hormones that slow down muscles in the digestive tract.  You may develop or continue to have constipation because increased hormones slow digestion and cause the muscles that push waste through your intestines to relax.  You may develop hemorrhoids. These are swollen veins (varicose veins) in the rectum that can itch or be painful.  You may develop swollen, bulging veins (varicose veins) in your legs.  You may have increased body aches in the pelvis, back, or thighs. This is due to weight gain and increased hormones that are relaxing your joints.  You may have changes in your hair. These can include thickening of your hair, rapid growth, and changes in texture. Some women also have hair loss during or after pregnancy, or hair that feels dry or thin. Your hair will most likely return to normal after your baby is born.  Your breasts will continue to grow and they will continue to become tender. A yellow fluid (colostrum) may leak from your breasts. This is the first milk you are producing for your baby.  Your belly button may stick out.  You may notice more swelling in your hands,  face, or ankles.  You may have increased tingling or numbness in your hands, arms, and legs. The skin on your belly may also feel numb.  You may feel short of breath because of your expanding uterus.  You may have more problems sleeping. This can be caused by the size of your belly, increased need to urinate, and an increase in your body's metabolism.  You may notice the fetus "dropping," or moving lower in your abdomen (lightening).  You may have increased vaginal discharge.  You may notice your joints feel loose and you may have pain around your pelvic bone. What to expect at prenatal visits You will have prenatal exams every 2 weeks until week 36. Then you will have weekly prenatal exams. During a routine prenatal visit:  You will be weighed to make sure you and the baby are growing normally.  Your blood pressure will be taken.  Your abdomen will be measured to track your baby's growth.  The fetal heartbeat will be listened to.  Any test results from the previous visit will be discussed.  You may have a cervical check near your due date to see if your cervix has softened or thinned (effaced).  You will be tested for Group B streptococcus. This happens between 35 and 37 weeks. Your health care provider may ask you:  What your birth plan is.  How you are feeling.  If you are feeling the baby move.  If you have had any abnormal   symptoms, such as leaking fluid, bleeding, severe headaches, or abdominal cramping.  If you are using any tobacco products, including cigarettes, chewing tobacco, and electronic cigarettes.  If you have any questions. Other tests or screenings that may be performed during your third trimester include:  Blood tests that check for low iron levels (anemia).  Fetal testing to check the health, activity level, and growth of the fetus. Testing is done if you have certain medical conditions or if there are problems during the pregnancy.  Nonstress test  (NST). This test checks the health of your baby to make sure there are no signs of problems, such as the baby not getting enough oxygen. During this test, a belt is placed around your belly. The baby is made to move, and its heart rate is monitored during movement. What is false labor? False labor is a condition in which you feel small, irregular tightenings of the muscles in the womb (contractions) that usually go away with rest, changing position, or drinking water. These are called Braxton Hicks contractions. Contractions may last for hours, days, or even weeks before true labor sets in. If contractions come at regular intervals, become more frequent, increase in intensity, or become painful, you should see your health care provider. What are the signs of labor?  Abdominal cramps.  Regular contractions that start at 10 minutes apart and become stronger and more frequent with time.  Contractions that start on the top of the uterus and spread down to the lower abdomen and back.  Increased pelvic pressure and dull back pain.  A watery or bloody mucus discharge that comes from the vagina.  Leaking of amniotic fluid. This is also known as your "water breaking." It could be a slow trickle or a gush. Let your health care provider know if it has a color or strange odor. If you have any of these signs, call your health care provider right away, even if it is before your due date. Follow these instructions at home: Medicines  Follow your health care provider's instructions regarding medicine use. Specific medicines may be either safe or unsafe to take during pregnancy.  Take a prenatal vitamin that contains at least 600 micrograms (mcg) of folic acid.  If you develop constipation, try taking a stool softener if your health care provider approves. Eating and drinking   Eat a balanced diet that includes fresh fruits and vegetables, whole grains, good sources of protein such as meat, eggs, or tofu,  and low-fat dairy. Your health care provider will help you determine the amount of weight gain that is right for you.  Avoid raw meat and uncooked cheese. These carry germs that can cause birth defects in the baby.  If you have low calcium intake from food, talk to your health care provider about whether you should take a daily calcium supplement.  Eat four or five small meals rather than three large meals a day.  Limit foods that are high in fat and processed sugars, such as fried and sweet foods.  To prevent constipation: ? Drink enough fluid to keep your urine clear or pale yellow. ? Eat foods that are high in fiber, such as fresh fruits and vegetables, whole grains, and beans. Activity  Exercise only as directed by your health care provider. Most women can continue their usual exercise routine during pregnancy. Try to exercise for 30 minutes at least 5 days a week. Stop exercising if you experience uterine contractions.  Avoid heavy lifting.  Do   not exercise in extreme heat or humidity, or at high altitudes.  Wear low-heel, comfortable shoes.  Practice good posture.  You may continue to have sex unless your health care provider tells you otherwise. Relieving pain and discomfort  Take frequent breaks and rest with your legs elevated if you have leg cramps or low back pain.  Take warm sitz baths to soothe any pain or discomfort caused by hemorrhoids. Use hemorrhoid cream if your health care provider approves.  Wear a good support bra to prevent discomfort from breast tenderness.  If you develop varicose veins: ? Wear support pantyhose or compression stockings as told by your healthcare provider. ? Elevate your feet for 15 minutes, 3-4 times a day. Prenatal care  Write down your questions. Take them to your prenatal visits.  Keep all your prenatal visits as told by your health care provider. This is important. Safety  Wear your seat belt at all times when driving.  Make  a list of emergency phone numbers, including numbers for family, friends, the hospital, and police and fire departments. General instructions  Avoid cat litter boxes and soil used by cats. These carry germs that can cause birth defects in the baby. If you have a cat, ask someone to clean the litter box for you.  Do not travel far distances unless it is absolutely necessary and only with the approval of your health care provider.  Do not use hot tubs, steam rooms, or saunas.  Do not drink alcohol.  Do not use any products that contain nicotine or tobacco, such as cigarettes and e-cigarettes. If you need help quitting, ask your health care provider.  Do not use any medicinal herbs or unprescribed drugs. These chemicals affect the formation and growth of the baby.  Do not douche or use tampons or scented sanitary pads.  Do not cross your legs for long periods of time.  To prepare for the arrival of your baby: ? Take prenatal classes to understand, practice, and ask questions about labor and delivery. ? Make a trial run to the hospital. ? Visit the hospital and tour the maternity area. ? Arrange for maternity or paternity leave through employers. ? Arrange for family and friends to take care of pets while you are in the hospital. ? Purchase a rear-facing car seat and make sure you know how to install it in your car. ? Pack your hospital bag. ? Prepare the baby's nursery. Make sure to remove all pillows and stuffed animals from the baby's crib to prevent suffocation.  Visit your dentist if you have not gone during your pregnancy. Use a soft toothbrush to brush your teeth and be gentle when you floss. Contact a health care provider if:  You are unsure if you are in labor or if your water has broken.  You become dizzy.  You have mild pelvic cramps, pelvic pressure, or nagging pain in your abdominal area.  You have lower back pain.  You have persistent nausea, vomiting, or diarrhea.   You have an unusual or bad smelling vaginal discharge.  You have pain when you urinate. Get help right away if:  Your water breaks before 37 weeks.  You have regular contractions less than 5 minutes apart before 37 weeks.  You have a fever.  You are leaking fluid from your vagina.  You have spotting or bleeding from your vagina.  You have severe abdominal pain or cramping.  You have rapid weight loss or weight gain.  You have   shortness of breath with chest pain.  You notice sudden or extreme swelling of your face, hands, ankles, feet, or legs.  Your baby makes fewer than 10 movements in 2 hours.  You have severe headaches that do not go away when you take medicine.  You have vision changes. Summary  The third trimester is from week 28 through week 40, months 7 through 9. The third trimester is a time when the unborn baby (fetus) is growing rapidly.  During the third trimester, your discomfort may increase as you and your baby continue to gain weight. You may have abdominal, leg, and back pain, sleeping problems, and an increased need to urinate.  During the third trimester your breasts will keep growing and they will continue to become tender. A yellow fluid (colostrum) may leak from your breasts. This is the first milk you are producing for your baby.  False labor is a condition in which you feel small, irregular tightenings of the muscles in the womb (contractions) that eventually go away. These are called Braxton Hicks contractions. Contractions may last for hours, days, or even weeks before true labor sets in.  Signs of labor can include: abdominal cramps; regular contractions that start at 10 minutes apart and become stronger and more frequent with time; watery or bloody mucus discharge that comes from the vagina; increased pelvic pressure and dull back pain; and leaking of amniotic fluid. This information is not intended to replace advice given to you by your health  care provider. Make sure you discuss any questions you have with your health care provider. Document Released: 09/05/2001 Document Revised: 01/02/2019 Document Reviewed: 10/17/2016 Elsevier Patient Education  2020 [employer]  

## 2019-04-01 NOTE — Progress Notes (Signed)
ROB- no concerns 

## 2019-04-15 ENCOUNTER — Other Ambulatory Visit: Payer: Self-pay

## 2019-04-15 ENCOUNTER — Encounter: Payer: Self-pay | Admitting: Advanced Practice Midwife

## 2019-04-15 ENCOUNTER — Ambulatory Visit (INDEPENDENT_AMBULATORY_CARE_PROVIDER_SITE_OTHER): Payer: Medicaid Other | Admitting: Advanced Practice Midwife

## 2019-04-15 ENCOUNTER — Ambulatory Visit (INDEPENDENT_AMBULATORY_CARE_PROVIDER_SITE_OTHER): Payer: Medicaid Other

## 2019-04-15 VITALS — BP 114/78 | Wt 209.0 lb

## 2019-04-15 DIAGNOSIS — Z3483 Encounter for supervision of other normal pregnancy, third trimester: Secondary | ICD-10-CM

## 2019-04-15 DIAGNOSIS — Z3689 Encounter for other specified antenatal screening: Secondary | ICD-10-CM

## 2019-04-15 DIAGNOSIS — Z362 Encounter for other antenatal screening follow-up: Secondary | ICD-10-CM

## 2019-04-15 DIAGNOSIS — Z3A34 34 weeks gestation of pregnancy: Secondary | ICD-10-CM

## 2019-04-15 NOTE — Progress Notes (Signed)
Routine Prenatal Care Visit  Subjective  Diana [name] is a 28 y.o. G3P1011 at [redacted]w[redacted]d being seen today for ongoing prenatal care.  She is currently monitored for the following issues for this low-risk pregnancy and has Supervision of other normal pregnancy, antepartum on their problem list.  ----------------------------------------------------------------------------------- Patient reports hip and groin pain. Discussed results of growth scan. Patient admits regular non-nutritive intake. She is encouraged to decrease carbohydrate consumption and increase hydration and physical activity. She has worries of baby growing too big and not fitting through her pelvis and then having an emergency c/section. Offered 39 week induction if she desires.  Contractions: Not present. Vag. Bleeding: None.  Movement: Present. Denies leaking of fluid.  ----------------------------------------------------------------------------------- The following portions of the patient's history were reviewed and updated as appropriate: allergies, current medications, past family history, past medical history, past social history, past surgical history and problem list. Problem list updated.   Objective  Blood pressure 114/78, weight 209 lb (94.8 kg), last menstrual period 08/17/2018. Pregravid weight 160 lb (72.6 kg) Total Weight Gain 49 lb (22.2 kg) Urinalysis: Urine Protein    Urine Glucose    Fetal Status: Fetal Heart Rate (bpm): 152 Fundal Height: 36 cm Movement: Present     Growth: 86%, 6 pounds 14 ounces, 92.1 cm, cephalic  General:  Alert, oriented and cooperative. Patient is in no acute distress.  Skin: Skin is warm and dry. No rash noted.   Cardiovascular: Normal heart rate noted  Respiratory: Normal respiratory effort, no problems with respiration noted  Abdomen: Soft, gravid, appropriate for gestational age. Pain/Pressure: Present     Pelvic:  Cervical exam deferred        Extremities: Normal range of  motion.  Edema: None  Mental Status: Normal mood and affect. Normal behavior. Normal judgment and thought content.   Assessment   28 y.o. G3P1011 at [redacted]w[redacted]d by  05/24/2019, by Last Menstrual Period presenting for routine prenatal visit  Plan   pregnancy3 Problems (from 08/17/18 to present)    Problem Noted Resolved   Supervision of other normal pregnancy, antepartum 10/09/2018 by Rexene Agent, CNM No   Overview Addendum 03/18/2019  8:22 AM by Homero Fellers, MD    [facility] Prenatal Labs  Dating L=7 Blood type: A/Positive/-- (01/15 0931)   Genetic Screen  NIPS: normal xx Antibody:Negative (01/15 0931)  Anatomic [place] complete Rubella: 1.40 (01/15 0931) Varicella: Immune  GTT  Third trimester: 91 RPR: Non Reactive (01/15 0931)   Rhogam N/A HBsAg: Negative (01/15 0931)   TDaP vaccine           Flu Shot: HIV: Non Reactive (01/15 0931)   Baby Food    Breast                               GBS:   Contraception    Progesterone pill Pap: 08/26/2018, NILM.  CBB     No   CS/VBAC    N/A   Support Person              Preterm labor symptoms and general obstetric precautions including but not limited to vaginal bleeding, contractions, leaking of fluid and fetal movement were reviewed in detail with the patient. Please refer to After Visit Summary for other counseling recommendations. Increase physical activity- epsom salt soaks, exercise in pool, avoid reclining for long periods   Return in about 2 weeks (around 04/29/2019) for rob.  Opal Sidles  Higinio RogerGledhill, CNM 04/15/2019 9:35 AM

## 2019-04-15 NOTE — Progress Notes (Signed)
U/s today no vb. No lof

## 2019-04-29 ENCOUNTER — Other Ambulatory Visit (HOSPITAL_COMMUNITY)
Admission: RE | Admit: 2019-04-29 | Discharge: 2019-04-29 | Disposition: A | Discharge status: Home / Self Care | Payer: Medicaid Other | Source: Ambulatory Visit | Attending: Maternal Newborn | Admitting: Maternal Newborn

## 2019-04-29 ENCOUNTER — Other Ambulatory Visit: Payer: Self-pay

## 2019-04-29 ENCOUNTER — Ambulatory Visit (INDEPENDENT_AMBULATORY_CARE_PROVIDER_SITE_OTHER): Payer: Medicaid Other | Admitting: Maternal Newborn

## 2019-04-29 ENCOUNTER — Encounter: Payer: Self-pay | Admitting: Maternal Newborn

## 2019-04-29 VITALS — BP 130/70 | Wt 215.2 lb

## 2019-04-29 DIAGNOSIS — Z3480 Encounter for supervision of other normal pregnancy, unspecified trimester: Secondary | ICD-10-CM

## 2019-04-29 DIAGNOSIS — Z3483 Encounter for supervision of other normal pregnancy, third trimester: Secondary | ICD-10-CM

## 2019-04-29 DIAGNOSIS — Z3A36 36 weeks gestation of pregnancy: Secondary | ICD-10-CM

## 2019-04-29 LAB — POCT URINALYSIS DIPSTICK OB
Glucose, UA: NEGATIVE
POC,PROTEIN,UA: NEGATIVE

## 2019-04-29 NOTE — Progress Notes (Addendum)
    Routine Prenatal Care Visit  Subjective  Diana [name] is a 28 y.o. G3P1011 at [redacted]w[redacted]d being seen today for ongoing prenatal care.  She is currently monitored for the following issues for this low-risk pregnancy and has Supervision of other normal pregnancy, antepartum on their problem list.  ----------------------------------------------------------------------------------- Patient reports occasional Braxton Hicks contractions. Contractions: Not present. Vag. Bleeding: None.  Movement: Present. No leaking of fluid.  ----------------------------------------------------------------------------------- The following portions of the patient's history were reviewed and updated as appropriate: allergies, current medications, past family history, past medical history, past social history, past surgical history and problem list. Problem list updated.  Objective  Blood pressure 130/70, weight 215 lb 3.2 oz (97.6 kg), last menstrual period 08/17/2018. Pregravid weight 160 lb (72.6 kg) Total Weight Gain 55 lb 3.2 oz (25 kg) Urinalysis: Urine dipstick shows negative for glucose, protein.  Fetal Status: Fetal Heart Rate (bpm): 136 Fundal Height: 38 cm Movement: Present  Presentation: Vertex  General:  Alert, oriented and cooperative. Patient is in no acute distress.  Skin: Skin is warm and dry. No rash noted.   Cardiovascular: Normal heart rate noted  Respiratory: Normal respiratory effort, no problems with respiration noted  Abdomen: Soft, gravid, appropriate for gestational age. Pain/Pressure: Present     Pelvic:  Cervical exam performed Dilation: Closed Effacement (%): Thick Station: -3  Extremities: Normal range of motion.  Edema: None  Mental Status: Normal mood and affect. Normal behavior. Normal judgment and thought content.     Assessment   28 y.o. [id number] at [redacted]w[redacted]d, [place] 05/24/2019 by Last Menstrual Period presenting for a routine prenatal visit.  Plan   pregnancy3 Problems (from  08/17/18 to present)    Problem Noted Resolved   Supervision of other normal pregnancy, antepartum 10/09/2018 by Rexene Agent, CNM No   Overview Addendum 03/18/2019  8:22 AM by Homero Fellers, MD    [facility] Prenatal Labs  Dating L=7 Blood type: A/Positive/-- (01/15 0931)   Genetic Screen  NIPS: normal xx Antibody:Negative (01/15 0931)  Anatomic [place] complete Rubella: 1.40 (01/15 0931) Varicella: Immune  GTT  Third trimester: 91 RPR: Non Reactive (01/15 0931)   Rhogam N/A HBsAg: Negative (01/15 0931)   TDaP vaccine           Flu Shot: HIV: Non Reactive (01/15 0931)   Baby Food    Breast                               GBS:   Contraception    Progesterone pill Pap: 08/26/2018, NILM.  CBB     No   CS/VBAC    N/A   Support Person           GBS and Aptima today.   Preterm labor symptoms and general obstetric precautions including but not limited to vaginal bleeding, contractions, leaking of fluid and fetal movement were reviewed.  Please refer to After Visit Summary for other counseling recommendations.   Return in about 1 week (around 05/06/2019) for [place].  Avel Sensor, CNM 04/29/2019  8:49 AM

## 2019-04-29 NOTE — Patient Instructions (Signed)
Third Trimester of Pregnancy The third trimester is from week 28 through week 40 (months 7 through 9). The third trimester is a time when the unborn baby (fetus) is growing rapidly. At the end of the ninth month, the fetus is about 20 inches in length and weighs 6-10 pounds. Body changes during your third trimester Your body will continue to go through many changes during pregnancy. The changes vary from woman to woman. During the third trimester:  Your weight will continue to increase. You can expect to gain 25-35 pounds (11-16 kg) by the end of the pregnancy.  You may begin to get stretch marks on your hips, abdomen, and breasts.  You may urinate more often because the fetus is moving lower into your pelvis and pressing on your bladder.  You may develop or continue to have heartburn. This is caused by increased hormones that slow down muscles in the digestive tract.  You may develop or continue to have constipation because increased hormones slow digestion and cause the muscles that push waste through your intestines to relax.  You may develop hemorrhoids. These are swollen veins (varicose veins) in the rectum that can itch or be painful.  You may develop swollen, bulging veins (varicose veins) in your legs.  You may have increased body aches in the pelvis, back, or thighs. This is due to weight gain and increased hormones that are relaxing your joints.  You may have changes in your hair. These can include thickening of your hair, rapid growth, and changes in texture. Some women also have hair loss during or after pregnancy, or hair that feels dry or thin. Your hair will most likely return to normal after your baby is born.  Your breasts will continue to grow and they will continue to become tender. A yellow fluid (colostrum) may leak from your breasts. This is the first milk you are producing for your baby.  Your belly button may stick out.  You may notice more swelling in your hands,  face, or ankles.  You may have increased tingling or numbness in your hands, arms, and legs. The skin on your belly may also feel numb.  You may feel short of breath because of your expanding uterus.  You may have more problems sleeping. This can be caused by the size of your belly, increased need to urinate, and an increase in your body's metabolism.  You may notice the fetus "dropping," or moving lower in your abdomen (lightening).  You may have increased vaginal discharge.  You may notice your joints feel loose and you may have pain around your pelvic bone. What to expect at prenatal visits You will have prenatal exams every 2 weeks until week 36. Then you will have weekly prenatal exams. During a routine prenatal visit:  You will be weighed to make sure you and the baby are growing normally.  Your blood pressure will be taken.  Your abdomen will be measured to track your baby's growth.  The fetal heartbeat will be listened to.  Any test results from the previous visit will be discussed.  You may have a cervical check near your due date to see if your cervix has softened or thinned (effaced).  You will be tested for Group B streptococcus. This happens between 35 and 37 weeks. Your health care provider may ask you:  What your birth plan is.  How you are feeling.  If you are feeling the baby move.  If you have had any abnormal   symptoms, such as leaking fluid, bleeding, severe headaches, or abdominal cramping.  If you are using any tobacco products, including cigarettes, chewing tobacco, and electronic cigarettes.  If you have any questions. Other tests or screenings that may be performed during your third trimester include:  Blood tests that check for low iron levels (anemia).  Fetal testing to check the health, activity level, and growth of the fetus. Testing is done if you have certain medical conditions or if there are problems during the pregnancy.  Nonstress test  (NST). This test checks the health of your baby to make sure there are no signs of problems, such as the baby not getting enough oxygen. During this test, a belt is placed around your belly. The baby is made to move, and its heart rate is monitored during movement. What is false labor? False labor is a condition in which you feel small, irregular tightenings of the muscles in the womb (contractions) that usually go away with rest, changing position, or drinking water. These are called Braxton Hicks contractions. Contractions may last for hours, days, or even weeks before true labor sets in. If contractions come at regular intervals, become more frequent, increase in intensity, or become painful, you should see your health care provider. What are the signs of labor?  Abdominal cramps.  Regular contractions that start at 10 minutes apart and become stronger and more frequent with time.  Contractions that start on the top of the uterus and spread down to the lower abdomen and back.  Increased pelvic pressure and dull back pain.  A watery or bloody mucus discharge that comes from the vagina.  Leaking of amniotic fluid. This is also known as your "water breaking." It could be a slow trickle or a gush. Let your health care provider know if it has a color or strange odor. If you have any of these signs, call your health care provider right away, even if it is before your due date. Follow these instructions at home: Medicines  Follow your health care provider's instructions regarding medicine use. Specific medicines may be either safe or unsafe to take during pregnancy.  Take a prenatal vitamin that contains at least 600 micrograms (mcg) of folic acid.  If you develop constipation, try taking a stool softener if your health care provider approves. Eating and drinking   Eat a balanced diet that includes fresh fruits and vegetables, whole grains, good sources of protein such as meat, eggs, or tofu,  and low-fat dairy. Your health care provider will help you determine the amount of weight gain that is right for you.  Avoid raw meat and uncooked cheese. These carry germs that can cause birth defects in the baby.  If you have low calcium intake from food, talk to your health care provider about whether you should take a daily calcium supplement.  Eat four or five small meals rather than three large meals a day.  Limit foods that are high in fat and processed sugars, such as fried and sweet foods.  To prevent constipation: ? Drink enough fluid to keep your urine clear or pale yellow. ? Eat foods that are high in fiber, such as fresh fruits and vegetables, whole grains, and beans. Activity  Exercise only as directed by your health care provider. Most women can continue their usual exercise routine during pregnancy. Try to exercise for 30 minutes at least 5 days a week. Stop exercising if you experience uterine contractions.  Avoid heavy lifting.  Do   not exercise in extreme heat or humidity, or at high altitudes.  Wear low-heel, comfortable shoes.  Practice good posture.  You may continue to have sex unless your health care provider tells you otherwise. Relieving pain and discomfort  Take frequent breaks and rest with your legs elevated if you have leg cramps or low back pain.  Take warm sitz baths to soothe any pain or discomfort caused by hemorrhoids. Use hemorrhoid cream if your health care provider approves.  Wear a good support bra to prevent discomfort from breast tenderness.  If you develop varicose veins: ? Wear support pantyhose or compression stockings as told by your healthcare provider. ? Elevate your feet for 15 minutes, 3-4 times a day. Prenatal care  Write down your questions. Take them to your prenatal visits.  Keep all your prenatal visits as told by your health care provider. This is important. Safety  Wear your seat belt at all times when driving.  Make  a list of emergency phone numbers, including numbers for family, friends, the hospital, and police and fire departments. General instructions  Avoid cat litter boxes and soil used by cats. These carry germs that can cause birth defects in the baby. If you have a cat, ask someone to clean the litter box for you.  Do not travel far distances unless it is absolutely necessary and only with the approval of your health care provider.  Do not use hot tubs, steam rooms, or saunas.  Do not drink alcohol.  Do not use any products that contain nicotine or tobacco, such as cigarettes and e-cigarettes. If you need help quitting, ask your health care provider.  Do not use any medicinal herbs or unprescribed drugs. These chemicals affect the formation and growth of the baby.  Do not douche or use tampons or scented sanitary pads.  Do not cross your legs for long periods of time.  To prepare for the arrival of your baby: ? Take prenatal classes to understand, practice, and ask questions about labor and delivery. ? Make a trial run to the hospital. ? Visit the hospital and tour the maternity area. ? Arrange for maternity or paternity leave through employers. ? Arrange for family and friends to take care of pets while you are in the hospital. ? Purchase a rear-facing car seat and make sure you know how to install it in your car. ? Pack your hospital bag. ? Prepare the baby's nursery. Make sure to remove all pillows and stuffed animals from the baby's crib to prevent suffocation.  Visit your dentist if you have not gone during your pregnancy. Use a soft toothbrush to brush your teeth and be gentle when you floss. Contact a health care provider if:  You are unsure if you are in labor or if your water has broken.  You become dizzy.  You have mild pelvic cramps, pelvic pressure, or nagging pain in your abdominal area.  You have lower back pain.  You have persistent nausea, vomiting, or diarrhea.   You have an unusual or bad smelling vaginal discharge.  You have pain when you urinate. Get help right away if:  Your water breaks before 37 weeks.  You have regular contractions less than 5 minutes apart before 37 weeks.  You have a fever.  You are leaking fluid from your vagina.  You have spotting or bleeding from your vagina.  You have severe abdominal pain or cramping.  You have rapid weight loss or weight gain.  You have   shortness of breath with chest pain.  You notice sudden or extreme swelling of your face, hands, ankles, feet, or legs.  Your baby makes fewer than 10 movements in 2 hours.  You have severe headaches that do not go away when you take medicine.  You have vision changes. Summary  The third trimester is from week 28 through week 40, months 7 through 9. The third trimester is a time when the unborn baby (fetus) is growing rapidly.  During the third trimester, your discomfort may increase as you and your baby continue to gain weight. You may have abdominal, leg, and back pain, sleeping problems, and an increased need to urinate.  During the third trimester your breasts will keep growing and they will continue to become tender. A yellow fluid (colostrum) may leak from your breasts. This is the first milk you are producing for your baby.  False labor is a condition in which you feel small, irregular tightenings of the muscles in the womb (contractions) that eventually go away. These are called Braxton Hicks contractions. Contractions may last for hours, days, or even weeks before true labor sets in.  Signs of labor can include: abdominal cramps; regular contractions that start at 10 minutes apart and become stronger and more frequent with time; watery or bloody mucus discharge that comes from the vagina; increased pelvic pressure and dull back pain; and leaking of amniotic fluid. This information is not intended to replace advice given to you by your health  care provider. Make sure you discuss any questions you have with your health care provider. Document Released: 09/05/2001 Document Revised: 01/02/2019 Document Reviewed: 10/17/2016 Elsevier Patient Education  2020 [employer]  

## 2019-04-29 NOTE — Addendum Note (Signed)
Addended by: Drenda Freeze on: 04/29/2019 09:46 AM   Modules accepted: Orders

## 2019-04-29 NOTE — Progress Notes (Signed)
ROB/GBS- no concerns 

## 2019-05-01 LAB — CERVICOVAGINAL ANCILLARY ONLY
Chlamydia: NEGATIVE
Neisseria Gonorrhea: NEGATIVE

## 2019-05-01 LAB — STREP GP B NAA: Strep Gp B NAA: NEGATIVE

## 2019-05-07 ENCOUNTER — Ambulatory Visit (INDEPENDENT_AMBULATORY_CARE_PROVIDER_SITE_OTHER): Payer: Medicaid Other | Admitting: Obstetrics and Gynecology

## 2019-05-07 ENCOUNTER — Encounter: Payer: Self-pay | Admitting: Obstetrics and Gynecology

## 2019-05-07 ENCOUNTER — Other Ambulatory Visit: Payer: Self-pay

## 2019-05-07 VITALS — BP 126/74 | Wt 215.0 lb

## 2019-05-07 DIAGNOSIS — Z3480 Encounter for supervision of other normal pregnancy, unspecified trimester: Secondary | ICD-10-CM

## 2019-05-07 DIAGNOSIS — Z3A37 37 weeks gestation of pregnancy: Secondary | ICD-10-CM | POA: Diagnosis not present

## 2019-05-07 DIAGNOSIS — Z3483 Encounter for supervision of other normal pregnancy, third trimester: Secondary | ICD-10-CM

## 2019-05-07 LAB — POCT URINALYSIS DIPSTICK OB
Glucose, UA: NEGATIVE
POC,PROTEIN,UA: NEGATIVE

## 2019-05-07 NOTE — Progress Notes (Signed)
No vb. No lof. A lot of pressure.

## 2019-05-07 NOTE — Addendum Note (Signed)
Addended by: Brien Few on: 05/07/2019 04:35 PM   Modules accepted: Orders

## 2019-05-07 NOTE — Progress Notes (Signed)
    Routine Prenatal Care Visit  Subjective  Diana [name] is a 28 y.o. G3P1011 at [redacted]w[redacted]d being seen today for ongoing prenatal care.  She is currently monitored for the following issues for this low-risk pregnancy and has Supervision of other normal pregnancy, antepartum on their problem list.  ----------------------------------------------------------------------------------- Patient reports no complaints.   Contractions: Not present. Vag. Bleeding: None.  Movement: Present. Denies leaking of fluid.  ----------------------------------------------------------------------------------- The following portions of the patient's history were reviewed and updated as appropriate: allergies, current medications, past family history, past medical history, past social history, past surgical history and problem list. Problem list updated.   Objective  Blood pressure 126/74, weight 215 lb (97.5 kg), last menstrual period 08/17/2018. Pregravid weight 160 lb (72.6 kg) Total Weight Gain 55 lb (24.9 kg) Urinalysis:      Fetal Status: Fetal Heart Rate (bpm): 168 Fundal Height: 39 cm Movement: Present  Presentation: Vertex  General:  Alert, oriented and cooperative. Patient is in no acute distress.  Skin: Skin is warm and dry. No rash noted.   Cardiovascular: Normal heart rate noted  Respiratory: Normal respiratory effort, no problems with respiration noted  Abdomen: Soft, gravid, appropriate for gestational age. Pain/Pressure: Present     Pelvic:  Cervical exam performed     Station: -3- cervix very posterior, could not reach internal os.  Extremities: Normal range of motion.  Edema: None  Mental Status: Normal mood and affect. Normal behavior. Normal judgment and thought content.     Assessment   28 y.o. G3P1011 at [redacted]w[redacted]d by  05/24/2019, by Last Menstrual Period presenting for routine prenatal visit  Plan   pregnancy3 Problems (from 08/17/18 to present)    Problem Noted Resolved   Supervision of other normal pregnancy, antepartum 10/09/2018 by Rexene Agent, CNM No   Overview Addendum 04/29/2019  8:36 AM by Rexene Agent, [place] Prenatal Labs  Dating L=7 Blood type: A/Positive/-- (01/15 0931)   Genetic Screen  NIPS: normal xx Antibody:Negative (01/15 0931)  Anatomic [place] complete Rubella: 1.40 (01/15 0931) Varicella: Immune  GTT  Third trimester: 91 RPR: Non Reactive (01/15 0931)   Rhogam N/A HBsAg: Negative (01/15 0931)   TDaP vaccine  04/01/2019         Flu Shot: HIV: Non Reactive (01/15 0931)   Baby Food    Breast                               GBS:   Contraception    Progesterone pill Pap: 08/26/2018, NILM.  CBB     No   CS/VBAC    N/A   Support Person             NST: 155 bpm baseline, moderate variability, 15x15 accelerations, no decelerations.  Gestational age appropriate obstetric precautions including but not limited to vaginal bleeding, contractions, leaking of fluid and fetal movement were reviewed in detail with the patient.    Return in about 1 week (around 05/14/2019) for ROB in person.  Homero Fellers MD [facility], [facility] 05/07/2019, 3:50 PM

## 2019-05-12 ENCOUNTER — Telehealth: Payer: Self-pay

## 2019-05-12 NOTE — Telephone Encounter (Signed)
Pt calling; has questions about IOL.  [phone]

## 2019-05-12 NOTE — Telephone Encounter (Signed)
This encounter was created in error - please disregard.

## 2019-05-13 ENCOUNTER — Other Ambulatory Visit: Payer: Self-pay | Admitting: Maternal Newborn

## 2019-05-13 DIAGNOSIS — Z01818 Encounter for other preprocedural examination: Secondary | ICD-10-CM

## 2019-05-13 NOTE — Telephone Encounter (Signed)
Patient is calling to follow up about request. Please advise. Patient aware provider is seeing patient's will call asap

## 2019-05-14 ENCOUNTER — Ambulatory Visit
Admission: RE | Admit: 2019-05-14 | Discharge: 2019-05-14 | Disposition: A | Discharge status: Home / Self Care | Payer: Medicaid Other | Source: Ambulatory Visit

## 2019-05-15 ENCOUNTER — Ambulatory Visit (INDEPENDENT_AMBULATORY_CARE_PROVIDER_SITE_OTHER): Payer: Medicaid Other | Admitting: Certified Nurse Midwife

## 2019-05-15 ENCOUNTER — Other Ambulatory Visit
Admission: RE | Admit: 2019-05-15 | Discharge: 2019-05-15 | Disposition: A | Discharge status: Home / Self Care | Payer: Medicaid Other | Source: Ambulatory Visit | Attending: Certified Nurse Midwife | Admitting: Certified Nurse Midwife

## 2019-05-15 ENCOUNTER — Other Ambulatory Visit: Payer: Self-pay

## 2019-05-15 ENCOUNTER — Encounter: Payer: Self-pay | Admitting: Certified Nurse Midwife

## 2019-05-15 VITALS — BP 120/60 | Wt 217.0 lb

## 2019-05-15 DIAGNOSIS — Z3480 Encounter for supervision of other normal pregnancy, unspecified trimester: Secondary | ICD-10-CM

## 2019-05-15 DIAGNOSIS — Z20828 Contact with and (suspected) exposure to other viral communicable diseases: Secondary | ICD-10-CM | POA: Diagnosis not present

## 2019-05-15 DIAGNOSIS — Z3A38 38 weeks gestation of pregnancy: Secondary | ICD-10-CM

## 2019-05-15 DIAGNOSIS — Z3483 Encounter for supervision of other normal pregnancy, third trimester: Secondary | ICD-10-CM

## 2019-05-15 DIAGNOSIS — Z01812 Encounter for preprocedural laboratory examination: Secondary | ICD-10-CM | POA: Insufficient documentation

## 2019-05-15 LAB — POCT URINALYSIS DIPSTICK OB: Glucose, UA: NEGATIVE

## 2019-05-15 LAB — SARS CORONAVIRUS 2 (TAT 6-24 HRS): SARS Coronavirus 2: NEGATIVE

## 2019-05-15 MED ORDER — FLUCONAZOLE 150 MG PO TABS: 150.0000 mg | ORAL_TABLET | Freq: Once | ORAL | 0 refills | Status: AC

## 2019-05-15 MED ORDER — CLOTRIMAZOLE-BETAMETHASONE 1-0.05 % EX CREA: 1.0000 "application " | TOPICAL_CREAM | Freq: Two times a day (BID) | CUTANEOUS | 0 refills | Status: DC | PRN

## 2019-05-15 NOTE — Progress Notes (Signed)
C/o talk about cx being posterior. rj

## 2019-05-17 ENCOUNTER — Inpatient Hospital Stay: Payer: Medicaid Other | Admitting: Anesthesiology

## 2019-05-17 ENCOUNTER — Other Ambulatory Visit: Payer: Self-pay

## 2019-05-17 ENCOUNTER — Inpatient Hospital Stay
Admission: RE | Admit: 2019-05-17 | Discharge: 2019-05-19 | DRG: 806 | Disposition: A | Discharge status: Home / Self Care | Payer: Medicaid Other | Attending: Obstetrics and Gynecology | Admitting: Obstetrics and Gynecology

## 2019-05-17 DIAGNOSIS — O9081 Anemia of the puerperium: Secondary | ICD-10-CM | POA: Diagnosis not present

## 2019-05-17 DIAGNOSIS — Z20828 Contact with and (suspected) exposure to other viral communicable diseases: Secondary | ICD-10-CM | POA: Diagnosis present

## 2019-05-17 DIAGNOSIS — O26893 Other specified pregnancy related conditions, third trimester: Secondary | ICD-10-CM | POA: Diagnosis present

## 2019-05-17 DIAGNOSIS — Z3A39 39 weeks gestation of pregnancy: Secondary | ICD-10-CM

## 2019-05-17 DIAGNOSIS — D62 Acute posthemorrhagic anemia: Secondary | ICD-10-CM | POA: Diagnosis not present

## 2019-05-17 LAB — CBC
HCT: 32.1 % — ABNORMAL LOW (ref 36.0–46.0)
Hemoglobin: 10.8 g/dL — ABNORMAL LOW (ref 12.0–15.0)
MCH: 31.2 pg (ref 26.0–34.0)
MCHC: 33.6 g/dL (ref 30.0–36.0)
MCV: 92.8 fL (ref 80.0–100.0)
Platelets: 191 10*3/uL (ref 150–400)
RBC: 3.46 MIL/uL — ABNORMAL LOW (ref 3.87–5.11)
RDW: 12.9 % (ref 11.5–15.5)
WBC: 8.7 10*3/uL (ref 4.0–10.5)
nRBC: 0 % (ref 0.0–0.2)

## 2019-05-17 LAB — TYPE AND SCREEN
ABO/RH(D): A POS
Antibody Screen: NEGATIVE

## 2019-05-17 MED ORDER — OXYTOCIN 10 UNIT/ML IJ SOLN
INTRAMUSCULAR | Status: AC
  Filled 2019-05-17: qty 2

## 2019-05-17 MED ORDER — LIDOCAINE HCL (PF) 1 % IJ SOLN: 30.0000 mL | INTRAMUSCULAR | Status: DC | PRN

## 2019-05-17 MED ORDER — FENTANYL 2.5 MCG/ML W/ROPIVACAINE 0.15% IN NS 100 ML EPIDURAL (ARMC)
12.0000 mL/h | EPIDURAL | Status: DC
  Administered 2019-05-17: 12 mL/h via EPIDURAL

## 2019-05-17 MED ORDER — FENTANYL 2.5 MCG/ML W/ROPIVACAINE 0.15% IN NS 100 ML EPIDURAL (ARMC)
EPIDURAL | Status: AC
  Filled 2019-05-17: qty 100

## 2019-05-17 MED ORDER — EPHEDRINE 5 MG/ML INJ
10.0000 mg | INTRAVENOUS | Status: DC | PRN
  Filled 2019-05-17: qty 2

## 2019-05-17 MED ORDER — LIDOCAINE HCL (PF) 1 % IJ SOLN
INTRAMUSCULAR | Status: AC
  Filled 2019-05-17: qty 30

## 2019-05-17 MED ORDER — LACTATED RINGERS IV SOLN: 500.0000 mL | INTRAVENOUS | Status: DC | PRN

## 2019-05-17 MED ORDER — OXYTOCIN 40 UNITS IN NORMAL SALINE INFUSION - SIMPLE MED: 2.5000 [IU]/h | INTRAVENOUS | Status: DC

## 2019-05-17 MED ORDER — MISOPROSTOL 200 MCG PO TABS
ORAL_TABLET | ORAL | Status: AC
  Filled 2019-05-17: qty 4

## 2019-05-17 MED ORDER — IBUPROFEN 600 MG PO TABS
600.0000 mg | ORAL_TABLET | Freq: Four times a day (QID) | ORAL | Status: DC
  Administered 2019-05-17 – 2019-05-19 (×7): 600 mg via ORAL
  Filled 2019-05-17 (×7): qty 1

## 2019-05-17 MED ORDER — LACTATED RINGERS IV SOLN: 500.0000 mL | Freq: Once | INTRAVENOUS | Status: DC

## 2019-05-17 MED ORDER — LACTATED RINGERS IV SOLN
INTRAVENOUS | Status: DC
  Administered 2019-05-17 (×2): via INTRAVENOUS

## 2019-05-17 MED ORDER — BUTORPHANOL TARTRATE 1 MG/ML IJ SOLN: 1.0000 mg | INTRAMUSCULAR | Status: DC | PRN

## 2019-05-17 MED ORDER — ONDANSETRON HCL 4 MG/2ML IJ SOLN: 4.0000 mg | Freq: Four times a day (QID) | INTRAMUSCULAR | Status: DC | PRN

## 2019-05-17 MED ORDER — LIDOCAINE-EPINEPHRINE (PF) 1.5 %-1:200000 IJ SOLN
INTRAMUSCULAR | Status: DC | PRN
  Administered 2019-05-17: 3 mL via PERINEURAL

## 2019-05-17 MED ORDER — AMMONIA AROMATIC IN INHA
RESPIRATORY_TRACT | Status: AC
  Filled 2019-05-17: qty 10

## 2019-05-17 MED ORDER — PHENYLEPHRINE 40 MCG/ML (10ML) SYRINGE FOR IV PUSH (FOR BLOOD PRESSURE SUPPORT)
80.0000 ug | PREFILLED_SYRINGE | INTRAVENOUS | Status: DC | PRN
  Filled 2019-05-17: qty 10

## 2019-05-17 MED ORDER — OXYTOCIN BOLUS FROM INFUSION: 500.0000 mL | Freq: Once | INTRAVENOUS | Status: DC

## 2019-05-17 MED ORDER — LIDOCAINE HCL (PF) 1 % IJ SOLN
INTRAMUSCULAR | Status: DC | PRN
  Administered 2019-05-17: 3 mL

## 2019-05-17 MED ORDER — ACETAMINOPHEN 325 MG PO TABS: 650.0000 mg | ORAL_TABLET | ORAL | Status: DC | PRN

## 2019-05-17 MED ORDER — DIPHENHYDRAMINE HCL 50 MG/ML IJ SOLN: 12.5000 mg | INTRAMUSCULAR | Status: DC | PRN

## 2019-05-17 MED ORDER — OXYTOCIN 40 UNITS IN NORMAL SALINE INFUSION - SIMPLE MED
1.0000 m[IU]/min | INTRAVENOUS | Status: DC
  Administered 2019-05-17: 10:00:00 2 m[IU]/min via INTRAVENOUS
  Filled 2019-05-17: qty 1000

## 2019-05-17 MED ORDER — TERBUTALINE SULFATE 1 MG/ML IJ SOLN: 0.2500 mg | Freq: Once | INTRAMUSCULAR | Status: DC | PRN

## 2019-05-17 MED ORDER — BUPIVACAINE HCL (PF) 0.25 % IJ SOLN
INTRAMUSCULAR | Status: DC | PRN
  Administered 2019-05-17: 10 mL via EPIDURAL

## 2019-05-17 MED ORDER — MISOPROSTOL 25 MCG QUARTER TABLET
ORAL_TABLET | ORAL | Status: AC
  Filled 2019-05-17: qty 1

## 2019-05-17 NOTE — Anesthesia Procedure Notes (Signed)
Epidural Patient location during procedure: OB Start time: 05/17/2019 3:36 PM End time: 05/17/2019 3:47 PM  Staffing Anesthesiologist: Alvin Critchley, MD Performed: anesthesiologist   Preanesthetic Checklist Completed: patient identified, site marked, surgical consent, pre-op evaluation, timeout performed, IV checked, risks and benefits discussed and monitors and equipment checked  Epidural Patient position: sitting Prep: Betadine Patient monitoring: heart rate, continuous pulse ox and blood pressure Approach: midline Location: L3-L4 Injection technique: LOR air  Needle:  Needle type: Tuohy  Needle gauge: 17 G Needle length: 9 cm and 9 Catheter type: closed end flexible Catheter size: 19 Gauge Test dose: negative and 1.5% lidocaine with Epi 1:200 K  Assessment Sensory level: T8 Events: blood not aspirated, injection not painful, no injection resistance, negative IV test and no paresthesia  Additional Notes Time out called.  Patient placed in sitting position.  Back prepped and draped in sterile fashion.  A skin wheal was made in the L3-L4 interspace with 1% Lidocaine plain.  A 17G Tuohy needle was advanced into the epidural space by a loss opf resistance technique.  The epidural catheter was threaded 3 cm into the space and the TD was negative.  The catheter was affixed to the back in sterile fashion and the patient tolerated the procedure well.Reason for block:procedure for pain

## 2019-05-17 NOTE — Progress Notes (Signed)
  Labor Progress Note   28 y.o. [id number] @ [redacted]w[redacted]d , admitted for  Pregnancy, Labor Management.   Subjective:  Patient coping well through contractions. She felt wetness following RN exam.   Objective:  BP 120/82   Pulse 79   Temp 98.2 F (36.8 C) (Oral)   Resp 18   Ht 5\' 3"  (1.6 m)   Wt 97.5 kg   LMP 08/17/2018 (Approximate)   BMI 38.09 kg/m  Abd: gravid, ND, FHT present, mild tenderness on exam Extr: trace to 1+ bilateral pedal edema SVE: CERVIX: 3.5 cm dilated, 70 effaced, -1 station, cervix is posterior, clear fluid in vault and on inner thigh  EFM: FHR: 135 bpm, variability: moderate,  accelerations:  Present,  decelerations:  Absent Toco: Frequency: Every 2-4 minutes Labs: I have reviewed the patient's lab results.   Assessment & Plan:  G3P1011 @ [redacted]w[redacted]d, admitted for  Pregnancy and Labor/Delivery Management  1. Pain management: none. 2. FWB: FHT category 1.  3. ID: GBS negative 4. Labor management: continue pitocin induction  All discussed with patient, see orders   Rod Can, [facility] 05/17/2019  2:59 PM

## 2019-05-17 NOTE — Discharge Summary (Signed)
OB Discharge Summary     Patient Name: Diana [name] DOB: 08/12/1991 MRN: [id number]  Date of admission: 05/17/2019 Delivering provider: Tresea MallJane Gledhill, CNM  Date of Delivery: 05/17/2019  Date of discharge: 05/19/2019  Admitting diagnosis: elective induction of labor Intrauterine pregnancy: 7042w0d     Secondary diagnosis: None     Discharge diagnosis: Term Pregnancy Delivered                                                                                                Post partum procedures: None  Augmentation: AROM and Pitocin  Complications: None  Hospital course:  Induction of Labor With Vaginal Delivery   28 y.o. yo G3P1011 at 10142w0d was admitted to the hospital 05/17/2019 for induction of labor.  Indication for induction: elective.  Patient had an uncomplicated labor course as follows: Membrane Rupture Time/Date: 2:45 PM ,05/17/2019   Patient had delivery of viable female 8:46 PM, 05/17/2019  Details of delivery can be found in separate delivery note.    Patient had a routine postpartum course. Patient is discharged home 05/19/19.  Physical exam  Vitals:   05/18/19 1241 05/18/19 1917 05/18/19 2353 05/19/19 0740  BP: 121/67 130/90 119/63 132/78  Pulse: 62 (!) 104 64 67  Resp: 18 18 20 18   Temp: 98.2 F (36.8 C) 98.2 F (36.8 C) 98 F (36.7 C) 98.1 F (36.7 C)  TempSrc: Oral  Oral Oral  SpO2:  98% 100% 100%  Weight:      Height:       General: alert, cooperative and no distress Lochia: appropriate Uterine Fundus: firm Incision: N/A DVT Evaluation: No evidence of DVT   Labs: Lab Results  Component Value Date   WBC 10.3 05/18/2019   HGB 10.8 (L) 05/18/2019   HCT 33.3 (L) 05/18/2019   MCV 95.7 05/18/2019   PLT 167 05/18/2019    Discharge instruction: per After Visit Summary.  Medications:  Allergies as of 05/19/2019   No Known Allergies     Medication List    STOP taking these medications   clotrimazole-betamethasone cream Commonly known as:  Lotrisone   fluticasone 50 MCG/ACT nasal spray Commonly known as: FLONASE     TAKE these medications   multivitamin-prenatal 27-0.8 MG Tabs tablet Take 1 tablet by mouth daily at 12 noon.   norethindrone 0.35 MG tablet Commonly known as: MICRONOR Take 1 tablet (0.35 mg total) by mouth daily.   sucralfate 1 g tablet Commonly known as: Carafate Take 1 tablet (1 g total) by mouth 4 (four) times daily -  with meals and at bedtime.       Diet: routine diet  Activity: Advance as tolerated. Pelvic rest for 6 weeks.   Outpatient follow up: Follow-up Information    Tresea MallGledhill, Jane, CNM. Schedule an appointment as soon as possible for a visit in 6 week(s).   Specialty: Obstetrics Why: postpartum follow up Contact information: [address] [place] [id number] [phone]             Postpartum contraception: Progesterone only pills Rhogam Given postpartum: NA Rubella vaccine given postpartum: Rubella Immune  Varicella vaccine given postpartum: Varicella Immune TDaP given antepartum or postpartum: given antepartum  Newborn Data: Live born female Diana [name] Birth Weight:  3740 g, 8 pounds 4 ounces APGAR: 8, 9  Newborn Delivery   Birth date/time: 05/17/2019 20:46:00 Delivery type: Vaginal, Spontaneous       Baby Feeding: Breast  Disposition:home with mother  SIGNED:  Rexene Agent, CNM 05/19/2019 9:27 AM

## 2019-05-17 NOTE — Anesthesia Preprocedure Evaluation (Signed)
Anesthesia Evaluation  Patient identified by MRN, date of birth, ID band Patient awake    Reviewed: Allergy & Precautions, NPO status , Patient's Chart, lab work & pertinent test results  Airway Mallampati: II  TM Distance: >3 FB     Dental   Pulmonary neg pulmonary ROS,    Pulmonary exam normal        Cardiovascular negative cardio ROS Normal cardiovascular exam     Neuro/Psych negative neurological ROS  negative psych ROS   GI/Hepatic negative GI ROS, Neg liver ROS,   Endo/Other  negative endocrine ROS  Renal/GU negative Renal ROS  negative genitourinary   Musculoskeletal negative musculoskeletal ROS (+)   Abdominal Normal abdominal exam  (+)   Peds negative pediatric ROS (+)  Hematology negative hematology ROS (+)   Anesthesia Other Findings   Reproductive/Obstetrics (+) Pregnancy                             Anesthesia Physical Anesthesia Plan  ASA: II  Anesthesia Plan: Epidural   Post-op Pain Management:    Induction:   PONV Risk Score and Plan:   Airway Management Planned: Nasal Cannula  Additional Equipment:   Intra-op Plan:   Post-operative Plan:   Informed Consent: I have reviewed the patients History and Physical, chart, labs and discussed the procedure including the risks, benefits and alternatives for the proposed anesthesia with the patient or authorized representative who has indicated his/her understanding and acceptance.     Dental advisory given  Plan Discussed with: CRNA and Surgeon  Anesthesia Plan Comments:         Anesthesia Quick Evaluation

## 2019-05-17 NOTE — H&P (Signed)
OB History & Physical   History of Present Illness:  Chief Complaint: elective induction  HPI:  Diana [name] is a 28 y.o. 433P1011 female at 3181w0d daKathlen Brunswickted by LMP.  Her pregnancy has been uncomplicated.    She reports occasional contractions.   She denies leakage of fluid.   She denies vaginal bleeding.   She reports fetal movement.    Maternal Medical History:   Past Medical History:  Diagnosis Date  . Abnormal Pap smear of cervix   . History of Papanicolaou smear of cervix 05/10/2015; [id number]   neg; neg  . Immunization, viral disease    gardasil completed    Past Surgical History:  Procedure Laterality Date  . AUGMENTATION MAMMAPLASTY    . COLPOSCOPY  12/19/2010   lgsil    No Known Allergies  Prior to Admission medications   Medication Sig Start Date End Date Taking? Authorizing Provider  clotrimazole-betamethasone (LOTRISONE) cream Apply 1 application topically 2 (two) times daily as needed. 05/15/19   Farrel ConnersGutierrez, Colleen, CNM  fluticasone (FLONASE) 50 MCG/ACT nasal spray Place 2 sprays into both nostrils daily. 11/07/18   Lutricia Feiloemer, William P, PA-C  Prenatal Vit-Fe Fumarate-FA (MULTIVITAMIN-PRENATAL) 27-0.8 MG TABS tablet Take 1 tablet by mouth daily at 12 noon.    [provider]  sucralfate (CARAFATE) 1 g tablet Take 1 tablet (1 g total) by mouth 4 (four) times daily -  with meals and at bedtime. 03/18/19   Natale MilchSchuman, Christanna R, MD    OB History  Gravida Para Term Preterm AB Living  3 1 1   1 1   SAB TAB Ectopic Multiple Live Births    1     1    # Outcome Date GA Lbr Len/2nd Weight Sex Delivery Anes PTL Lv  3 Current           2 Term 05/24/10   3487 g F Vag-Spont   LIV  1 TAB             Prenatal care site: [facility]  Social History: She  reports that she has never smoked. She has never used smokeless tobacco. She reports previous alcohol use. She reports that she does not use drugs.  Family History: family history includes Aneurysm in her  brother; Atrial fibrillation in her father and sister; Cancer in her paternal grandfather; Diabetes in her maternal grandmother and mother.    Review of Systems:  Review of Systems  Constitutional: Negative.   HENT: Negative.   Eyes: Negative.   Respiratory: Negative.   Cardiovascular: Negative.   Gastrointestinal: Negative.   Genitourinary: Negative.   Musculoskeletal: Negative.   Skin: Negative.   Neurological: Negative.   Endo/Heme/Allergies: Negative.   Psychiatric/Behavioral: Negative.      Physical Exam:  BP (!) 145/93   Pulse 88   Temp 98 F (36.7 C) (Oral)   LMP 08/17/2018 (Approximate)   Constitutional: Well nourished, well developed female in no acute distress.  HEENT: normal Skin: Warm and dry.  Cardiovascular: Regular rate and rhythm.   Extremity: trace edema  Respiratory: Clear to auscultation bilateral. Normal respiratory effort Abdomen: FHT present Back: no CVAT Neuro: DTRs 2+, Cranial nerves grossly intact Psych: Alert and Oriented x3. No memory deficits. Normal mood and affect.  MS: normal gait, normal bilateral lower extremity ROM/strength/stability.  Pelvic exam: (female chaperone present) is not limited by body habitus EGBUS: within normal limits Vagina: within normal limits and with normal mucosa, blood streak following sweep Cervix: 3/60/-2, cervix swept  Pertinent Results:  Prenatal Labs: Blood type/Rh A positive  Antibody screen negative  Rubella Immune  Varicella Immune    RPR Non-reactive  HBsAg negative  HIV negative  GC negative  Chlamydia negative  Genetic screening Negative, female  1 hour GTT 91  3 hour GTT NA  GBS negative on 8/4   Baseline FHR: 135 beats/min   Variability: moderate   Accelerations: present   Decelerations: absent Contractions: present frequency: every 10 minutes Overall assessment: reassuring   Lab Results  Component Value Date   [place] NEGATIVE 05/15/2019  ]  Assessment:  Diana [name] is a 28 y.o. G73P1011 female at [redacted]w[redacted]d with elective induction of labor.   Plan:  1. Admit to Labor & Delivery  2. CBC, T&S, Clrs, IVF 3. GBS negative.   4. Fetal well-being: Category 1 5. Begin induction with pitocin, consider AROM  Rod Can, CNM 05/17/2019 9:45 AM

## 2019-05-18 LAB — CBC
HCT: 33.3 % — ABNORMAL LOW (ref 36.0–46.0)
Hemoglobin: 10.8 g/dL — ABNORMAL LOW (ref 12.0–15.0)
MCH: 31 pg (ref 26.0–34.0)
MCHC: 32.4 g/dL (ref 30.0–36.0)
MCV: 95.7 fL (ref 80.0–100.0)
Platelets: 167 10*3/uL (ref 150–400)
RBC: 3.48 MIL/uL — ABNORMAL LOW (ref 3.87–5.11)
RDW: 12.9 % (ref 11.5–15.5)
WBC: 10.3 10*3/uL (ref 4.0–10.5)
nRBC: 0 % (ref 0.0–0.2)

## 2019-05-18 MED ORDER — WITCH HAZEL-GLYCERIN EX PADS: 1.0000 "application " | MEDICATED_PAD | CUTANEOUS | Status: DC | PRN

## 2019-05-18 MED ORDER — BENZOCAINE-MENTHOL 20-0.5 % EX AERO
1.0000 "application " | INHALATION_SPRAY | CUTANEOUS | Status: DC | PRN
  Administered 2019-05-19: 1 via TOPICAL
  Filled 2019-05-18: qty 56

## 2019-05-18 MED ORDER — ONDANSETRON HCL 4 MG PO TABS: 4.0000 mg | ORAL_TABLET | ORAL | Status: DC | PRN

## 2019-05-18 MED ORDER — COCONUT OIL OIL
1.0000 "application " | TOPICAL_OIL | Status: DC | PRN
  Administered 2019-05-18: 1 via TOPICAL
  Filled 2019-05-18: qty 120

## 2019-05-18 MED ORDER — ACETAMINOPHEN 325 MG PO TABS: 650.0000 mg | ORAL_TABLET | ORAL | Status: DC | PRN

## 2019-05-18 MED ORDER — DIPHENHYDRAMINE HCL 25 MG PO CAPS: 25.0000 mg | ORAL_CAPSULE | Freq: Four times a day (QID) | ORAL | Status: DC | PRN

## 2019-05-18 MED ORDER — SIMETHICONE 80 MG PO CHEW: 80.0000 mg | CHEWABLE_TABLET | ORAL | Status: DC | PRN

## 2019-05-18 MED ORDER — DIBUCAINE (PERIANAL) 1 % EX OINT: 1.0000 "application " | TOPICAL_OINTMENT | CUTANEOUS | Status: DC | PRN

## 2019-05-18 MED ORDER — PRENATAL MULTIVITAMIN CH
1.0000 | ORAL_TABLET | Freq: Every day | ORAL | Status: DC
  Administered 2019-05-18: 12:00:00 1 via ORAL
  Filled 2019-05-18: qty 1

## 2019-05-18 MED ORDER — ONDANSETRON HCL 4 MG/2ML IJ SOLN: 4.0000 mg | INTRAMUSCULAR | Status: DC | PRN

## 2019-05-18 MED ORDER — SENNOSIDES-DOCUSATE SODIUM 8.6-50 MG PO TABS
2.0000 | ORAL_TABLET | Freq: Every day | ORAL | Status: DC
  Administered 2019-05-18: 22:00:00 2 via ORAL
  Filled 2019-05-18: qty 2

## 2019-05-18 MED ORDER — OXYTOCIN 40 UNITS IN NORMAL SALINE INFUSION - SIMPLE MED
INTRAVENOUS | Status: AC
  Filled 2019-05-18: qty 1000

## 2019-05-18 NOTE — Progress Notes (Signed)
ROB at 38wk5d: Irregular contractions. Spotting after last exam. Baby active. Scheduled for IOL on 8/22 FHR WNL, FH 39 cm Cervix: 1.5/50%/soft/ posterior/-2 Discussed elective IOL and use of Cytotec, foley bulb and/or Pitocin for induction. To call L&D 8/22 for bed availability before she drives to hospital.  Dalia Heading, CNM

## 2019-05-18 NOTE — Anesthesia Postprocedure Evaluation (Signed)
Anesthesia Post Note  Patient: Diana [name]  Procedure(s) Performed: AN AD HOC LABOR EPIDURAL  Patient location during evaluation: Mother Baby Anesthesia Type: Epidural Level of consciousness: awake and alert and oriented Pain management: pain level controlled Vital Signs Assessment: post-procedure vital signs reviewed and stable Respiratory status: spontaneous breathing Cardiovascular status: blood pressure returned to baseline Postop Assessment: no headache and epidural receding Anesthetic complications: no     Last Vitals:  Vitals:   05/18/19 0310 05/18/19 0915  BP: (!) 111/56 115/71  Pulse: 61 67  Resp:  20  Temp:  37.3 C  SpO2:  98%    Last Pain:  Vitals:   05/18/19 0915  TempSrc: Oral  PainSc:                  Diana [name]

## 2019-05-18 NOTE — Plan of Care (Signed)
Transferred to Room 336 from L&D. Oriented to Room, Educated in [place] and Administrator. V/O.

## 2019-05-18 NOTE — Lactation Note (Signed)
This note was copied from a baby's chart. Lactation Consultation Note  Patient Name: Diana [name] [id number] Date: 05/18/2019   Mom has reported sore nipples since beginning.  Mom does voice that it eases up some after a few sucks.  Discussed transient nipple tenderness and when it should go away.  Positional stripe noted after this breast feed on left nipple.  [name] has tight frenulum which was pointed out to mom.  Mom encouraged to try different feeding positions.  Demonstrated hand expression and encouraged to rub colostrum on nipples after feeding to prevent bacteria, lubricate, help with healing and discomfort.  Coconut oil and comfort gels given and instructed in alternating use.  Mom reports FOB has issue with being able to stick out tongue very far.  Mom also had breast augmentation since last baby.  Mom had lots of questions which were addressed by [facility] such as foods to avoid like sushi, would breast augmentation affect breast feeding, how to know Diana [name] is getting enough milk, what to do about tight frenulum and could it cause speech impediments, etc.  Mom encouraged to discuss tight frenulum with her Pediatrician.  Information given on area dentists that work with frenulum issues if needed to pursue.  Mom breast fed last baby for 1 year, but did have some nipple discomfort in beginning.  Reviewed supply and demand, newborn stomach size, feeding cues, normal course of lactation and routine newborn feeding patterns.  Lactation name and number on white board and encouraged to call with any questions, concerns or assistance.  Maternal Data Formula Feeding for Exclusion: No  Feeding Feeding Type: Breast Fed  LATCH Score Latch: Grasps breast easily, tongue down, lips flanged, rhythmical sucking.  Audible Swallowing: Spontaneous and intermittent  Type of Nipple: Everted at rest and after stimulation  Comfort (Breast/Nipple): Soft / non-tender  Hold (Positioning): No assistance needed  to correctly position infant at breast.  LATCH Score: 10  Interventions    Lactation Tools Discussed/Used     Consult Status      Jarold Motto 05/18/2019, 11:51 PM

## 2019-05-18 NOTE — Progress Notes (Signed)
   Subjective:  Patient reports she is doing well postpartum day 1. She is tolerating regular diet. Her pain is controlled with PO pain medication. She is ambulating and voiding without difficulty. She has some discomfort with latch. [place] suspects possible short frenulum.   Objective:  Vital signs in last 24 hours: Temp:  [98.1 F (36.7 C)-99.2 F (37.3 C)] 99.2 F (37.3 C) (08/23 0915) Pulse Rate:  [58-81] 67 (08/23 0915) Resp:  [16-20] 20 (08/23 0915) BP: (94-131)/(50-82) 115/71 (08/23 0915) SpO2:  [96 %-100 %] 98 % (08/23 0915)    General: NAD Pulmonary: no increased work of breathing Abdomen: non-distended, non-tender, fundus firm at level of umbilicus Extremities: no edema, no erythema, no tenderness  Results for orders placed or performed during the hospital encounter of 05/17/19 (from the past 72 hour(s))  CBC     Status: Abnormal   Collection Time: 05/17/19  9:27 AM  Result Value Ref Range   WBC 8.7 4.0 - 10.5 K/uL   RBC 3.46 (L) 3.87 - 5.11 MIL/uL   Hemoglobin 10.8 (L) 12.0 - 15.0 g/dL   HCT 32.1 (L) 36.0 - 46.0 %   MCV 92.8 80.0 - 100.0 fL   MCH 31.2 26.0 - 34.0 pg   MCHC 33.6 30.0 - 36.0 g/dL   RDW 12.9 11.5 - 15.5 %   Platelets 191 150 - 400 K/uL   nRBC 0.0 0.0 - 0.2 %    Comment: Performed at [facility], [address] [place], [place] [address]  Type and screen [place]     Status: None   Collection Time: 05/17/19  9:27 AM  Result Value Ref Range   ABO/RH(D) A POS    Antibody Screen NEG    Sample Expiration      05/20/2019,2359 Performed at [facility], [address] [place], [place] [address]   CBC     Status: Abnormal   Collection Time: 05/18/19  6:26 AM  Result Value Ref Range   WBC 10.3 4.0 - 10.5 K/uL   RBC 3.48 (L) 3.87 - 5.11 MIL/uL   Hemoglobin 10.8 (L) 12.0 - 15.0 g/dL   HCT 33.3 (L) 36.0 - 46.0 %   MCV 95.7 80.0 - 100.0 fL   MCH 31.0 26.0 - 34.0 pg   MCHC 32.4 30.0 - 36.0 g/dL   RDW 12.9  11.5 - 15.5 %   Platelets 167 150 - 400 K/uL   nRBC 0.0 0.0 - 0.2 %    Comment: Performed at [facility], [address], [place], [place] [address]    Assessment:   28 y.o. [phone] postpartum day # 1  Plan:    1) Acute blood loss anemia - hemodynamically stable and asymptomatic - po ferrous sulfate  2) A positive, Rubella Immune, Varicella Immune  3) TDAP status given antepartum  4) Feeding plan breast  5)  Education given regarding options for contraception, as well as compatibility with breast feeding if applicable.  Patient plans on oral progesterone-only contraceptive for contraception.  6) Disposition: continue routine postpartum care   Rod Can, [facility] 05/18/2019, 11:59 AM

## 2019-05-19 MED ORDER — NORETHINDRONE 0.35 MG PO TABS: 1.0000 | ORAL_TABLET | Freq: Every day | ORAL | 11 refills | Status: DC

## 2019-05-19 NOTE — Lactation Note (Signed)
This note was copied from a baby's chart. Lactation Consultation Note  Patient Name: Diana [name] XIPJA'[id number] Date: 05/19/2019 Reason for consult: Follow-up assessment;Nipple pain/trauma;Breast augmentation  LC introduced herself to MOB this morning before discharge. Mom continues to have complaints of sore and tender nipples, with some nipple damage on left breast.  [name] was sleeping while LC spoke with mom, unable to observe a feed, or see tight frenulum that previous LC noted. Mom had questions regarding milk transfer and ensuring that baby was getting enough milk: provided education on identifying swallows, breast massage and compression while breastfeeding, changes in color/consistency of stool diapers, number of wet/stool diapers to expect with baby's age of life, and contentment between feeds. Reviewed timing of typical growth spurts and cluster feedings, encouraging to feed on demand when a display of hunger cues is evident. Reviewed normal course of lactation, changing from colostrum to transitional to mature milk. Importance of frequent feeds, and breast emptying. Provided mom with nipple care and pain maintenance guidance: coconut oil, hand expressed colostrum post feeds, comfort gels, alternating positions. Mom reports having pediatrician follow-up on Wednesday, and plans address the concern of tongue and/or lip tie, before calling the information provided by other [facility] for a pediatric dentist.  Provided mom with outpatient lactation support information: phone number, and availability of consultations. Gave information for virtual breastfeeding support group- where to find the link on the web site. Encouraged mom to call with any questions or concerns before discharge, and once home from the hospital.  Maternal Data Formula Feeding for Exclusion: No Has patient been taught Hand Expression?: Yes Does the patient have breastfeeding experience prior to this delivery?:  Yes  Feeding Feeding Type: Breast Fed  LATCH Score                   Interventions Interventions: Breast feeding basics reviewed  Lactation Tools Discussed/Used     Consult Status Consult Status: Complete Date: 05/19/19 Follow-up type: Call as needed    Diana [name] 05/19/2019, 11:21 AM

## 2019-05-19 NOTE — Progress Notes (Signed)
DC instr reviewed with pt.  Verb u/o of f/u care and appointments. Reviewed red flag warning s/s to report to provider.

## 2019-05-19 NOTE — Progress Notes (Signed)
DC home with NB daughter.  To car via nursing staff

## 2019-05-20 LAB — RPR: RPR Ser Ql: NONREACTIVE

## 2019-06-27 ENCOUNTER — Ambulatory Visit (INDEPENDENT_AMBULATORY_CARE_PROVIDER_SITE_OTHER): Payer: Medicaid Other | Admitting: Advanced Practice Midwife

## 2019-06-27 ENCOUNTER — Other Ambulatory Visit: Payer: Self-pay

## 2019-06-27 ENCOUNTER — Encounter: Payer: Self-pay | Admitting: Advanced Practice Midwife

## 2019-06-27 DIAGNOSIS — Z3041 Encounter for surveillance of contraceptive pills: Secondary | ICD-10-CM

## 2019-06-27 DIAGNOSIS — Z1389 Encounter for screening for other disorder: Secondary | ICD-10-CM | POA: Diagnosis not present

## 2019-06-27 MED ORDER — NORGESTIM-ETH ESTRAD TRIPHASIC 0.18/0.215/0.25 MG-35 MCG PO TABS: 1.0000 | ORAL_TABLET | Freq: Every day | ORAL | 4 refills | Status: DC

## 2019-06-27 NOTE — Progress Notes (Signed)
Postpartum Visit  Chief Complaint:  Chief Complaint  Patient presents with  . Postpartum Care    Vaginal delivery 8/22    History of Present Illness: Patient is a 28 y.o. [id number] presents for postpartum visit.  Review the Delivery Report for details.  Date of delivery: 05/17/2019 Type of delivery: Vaginal delivery - Vacuum or forceps assisted  no Episiotomy No.  Laceration: bilateral labial abrasions without repair  Pregnancy or labor problems:  no Any problems since the delivery:  She has been bleeding since delivery with some irregular stopping and starting. She admits to having intercourse at 2 weeks postpartum since she thought the bleeding had stopped at that time. She had started taking her progesterone only birth control pills prior to intercourse. She stopped breastfeeding at about 4 weeks postpartum. She denies any s/s of pelvic infection- no fever or tenderness or foul smelling discharge. She has no breast concerns.  Newborn Details:  SINGLETON :  15. BabyGender female. Birth weight: 8 pounds 4 ounces Maternal Details:  Breast or formula feeding: formula feeding now.  Intercourse: Yes  Contraception after delivery: progesterone only pills Any bowel or bladder issues: No  Post partum depression/anxiety noted:  no Edinburgh Post-Partum Depression Score: 9 Date of last PAP: 1 year ago  no abnormalities   Review of Systems: Review of Systems  Constitutional: Negative.   HENT: Negative.   Eyes: Negative.   Respiratory: Negative.   Cardiovascular: Negative.   Gastrointestinal: Negative.   Genitourinary: Negative.   Musculoskeletal: Negative.   Skin: Negative.   Neurological: Negative.   Endo/Heme/Allergies: Negative.   Psychiatric/Behavioral: Negative.      Past Medical History:  Past Medical History:  Diagnosis Date  . Abnormal Pap smear of cervix   . History of Papanicolaou smear of cervix 05/10/2015; [id number]   neg; neg  . Immunization, viral disease     gardasil completed    Past Surgical History:  Past Surgical History:  Procedure Laterality Date  . AUGMENTATION MAMMAPLASTY    . COLPOSCOPY  12/19/2010   lgsil    Family History:  Family History  Problem Relation Age of Onset  . Diabetes Mother        Type 2  . Atrial fibrillation Father   . Diabetes Maternal Grandmother   . Atrial fibrillation Sister   . Aneurysm Brother        brain - half brother  . Cancer Paternal Grandfather        lung    Social History:  Social History   Socioeconomic History  . Marital status: Single    Spouse name: Not on file  . Number of children: Not on file  . Years of education: 46  . Highest education level: Not on file  Occupational History  . 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IR STYLIST  Social Needs  . Financial resource strain: Not on file  . Food insecurity    Worry: Not on file    Inability: Not on file  . Transportation needs    Medical: Not on file    Non-medical: Not on file  Tobacco Use  . Smoking status: Never Smoker  . Smokeless tobacco: Never Used  Substance and Sexual Activity  . Alcohol use: Not Currently    Comment: occ  . Drug use: No  . Sexual activity: Yes    Birth control/protection: Pill  Lifestyle  . Physical activity    Days per week: Not on file    Minutes per session: Not on  file  . Stress: Not on file  Relationships  . Social Musicianconnections    Talks on phone: Not on file    Gets together: Not on file    Attends religious service: Not on file    Active member of club or organization: Not on file    Attends meetings of clubs or organizations: Not on file    Relationship status: Not on file  . Intimate partner violence    Fear of current or ex partner: Not on file    Emotionally abused: Not on file    Physically abused: Not on file    Forced sexual activity: Not on file  Other Topics Concern  . Not on file  Social History Narrative  . Not on file    Allergies:  No Known Allergies  Medications: Prior to  Admission medications   Medication Sig Start Date End Date Taking? Authorizing Provider  Norgestimate-Ethinyl Estradiol Triphasic 0.18/0.215/0.25 MG-35 MCG tablet Take 1 tablet by mouth daily. 06/27/19   Tresea MallGledhill, Jovanny Stephanie, CNM  Prenatal Vit-Fe Fumarate-FA (MULTIVITAMIN-PRENATAL) 27-0.8 MG TABS tablet Take 1 tablet by mouth daily at 12 noon.    [provider]    Physical Exam Blood pressure 110/72, pulse (!) 58, height 5\' 9"  (1.753 m), weight 184 lb (83.5 kg), last menstrual period 08/17/2018, not currently breastfeeding.    General: NAD HEENT: normocephalic, anicteric Pulmonary: No increased work of breathing Abdomen: NABS, soft, non-tender, non-distended.  Umbilicus without lesions.  No hepatomegaly, splenomegaly or masses palpable. No evidence of hernia. Genitourinary:  External: Normal external female genitalia.  Normal urethral meatus, normal Bartholin's and Skene's glands.    Vagina: Normal vaginal mucosa, no evidence of prolapse.    Cervix: Grossly normal in appearance, no bleeding, no CMT  Uterus: Non-enlarged, mobile, normal contour.    Adnexa: ovaries non-enlarged, no adnexal masses  Rectal: deferred Extremities: no edema, erythema, or tenderness Neurologic: Grossly intact Psychiatric: mood appropriate, affect full  Edinburgh Postnatal Depression Scale - 06/27/19 0924      Edinburgh Postnatal Depression Scale:  In the Past 7 Days   I have been able to laugh and see the funny side of things.  0    I have looked forward with enjoyment to things.  0    I have blamed myself unnecessarily when things went wrong.  2    I have been anxious or worried for no good reason.  2    I have felt scared or panicky for no good reason.  1    Things have been getting on top of me.  2    I have been so unhappy that I have had difficulty sleeping.  0    I have felt sad or miserable.  1    I have been so unhappy that I have been crying.  1    The thought of harming myself has occurred  to me.  0    Edinburgh Postnatal Depression Scale Total  9       Assessment: 28 y.o. [id number] presenting for 6 week postpartum visit  Plan: Problem List Items Addressed This Visit    None    Visit Diagnoses    6 weeks postpartum follow-up    -  Primary   Relevant Medications   Norgestimate-Ethinyl Estradiol Triphasic 0.18/0.215/0.25 MG-35 MCG tablet   Encounter for surveillance of contraceptive pills       Relevant Medications   Norgestimate-Ethinyl Estradiol Triphasic 0.18/0.215/0.25 MG-35 MCG tablet  1) Contraception - Education given regarding options for contraception, as well as compatibility with breast feeding if applicable.  Patient plans on changing to  OCP (estrogen/progesterone) for contraception today.  2)  Pap - ASCCP guidelines and rational discussed.  ASCCP guidelines and rational discussed.  Patient opts for every 3 years screening interval  3) Patient underwent screening for postpartum depression with no signs of depression  4) Return in about 1 year (around 06/26/2020) for annual established gyn.   Tresea Mall, CNM [facility] 06/27/2019, 1:38 PM

## 2019-09-12 ENCOUNTER — Other Ambulatory Visit: Payer: Medicaid Other

## 2019-09-21 ENCOUNTER — Other Ambulatory Visit: Payer: Self-pay | Admitting: Certified Nurse Midwife

## 2019-12-03 ENCOUNTER — Ambulatory Visit (INDEPENDENT_AMBULATORY_CARE_PROVIDER_SITE_OTHER)
Admission: RE | Admit: 2019-12-03 | Discharge: 2019-12-03 | Disposition: A | Discharge status: Home / Self Care | Payer: Medicaid Other | Source: Ambulatory Visit

## 2019-12-03 ENCOUNTER — Other Ambulatory Visit: Payer: Self-pay

## 2019-12-03 DIAGNOSIS — J0100 Acute maxillary sinusitis, unspecified: Secondary | ICD-10-CM

## 2019-12-03 MED ORDER — AMOXICILLIN-POT CLAVULANATE 875-125 MG PO TABS: 1.0000 | ORAL_TABLET | Freq: Two times a day (BID) | ORAL | 0 refills | Status: DC

## 2019-12-03 NOTE — Discharge Instructions (Addendum)
Take the antibiotic as directed.  Follow up with your primary care provider if your symptoms are not improving.     

## 2019-12-03 NOTE — ED Provider Notes (Signed)
Virtual Visit via Video Note:  DAJSHA MASSARO  initiated request for Telemedicine visit with [facility] Urgent [facility] team. I connected with Kathlen Brunswick  on 12/03/2019 at 10:12 AM  for a synchronized telemedicine visit using a video enabled HIPPA compliant telemedicine application. I verified that I am speaking with Kathlen Brunswick  using two identifiers. Mickie Bail, NP  was physically located in a [facility] Urgent care site and JENNIFR GAETA was located at a different location.   The limitations of evaluation and management by telemedicine as well as the availability of in-person appointments were discussed. Patient was informed that she  may incur a bill ( including co-pay) for this virtual visit encounter. KEISI ECKFORD  expressed understanding and gave verbal consent to proceed with virtual visit.     History of Present Illness:Diana [name]  is a 29 y.o. female presents for evaluation of 2 week history of congestion, green sinus drainage, maxillary tenderness, and nonproductive cough.  She denies fever, chills, shortness of breath, vomiting, diarrhea, rash, or other symptoms.  Treating symptoms with Dayquil, Tylenol, Sudafed.  She denies pregnancy or breastfeeding.     No Known Allergies   Past Medical History:  Diagnosis Date  . Abnormal Pap smear of cervix   . History of Papanicolaou smear of cervix 05/10/2015; [id number]   neg; neg  . Immunization, viral disease    gardasil completed     Social History   Tobacco Use  . Smoking status: Never Smoker  . Smokeless tobacco: Never Used  Substance Use Topics  . Alcohol use: Not Currently    Comment: occ  . Drug use: No   ROS: as stated in HPI.  All other systems reviewed and negative.       Observations/Objective: Physical Exam  VITALS: Patient denies fever. GENERAL: Alert, appears well and in no acute distress. HEENT: Atraumatic. NECK: Normal movements of the head and neck. CARDIOPULMONARY: No  increased WOB. Speaking in clear sentences. I:E ratio WNL.  MS: Moves all visible extremities without noticeable abnormality. PSYCH: Pleasant and cooperative, well-groomed. Speech normal rate and rhythm. Affect is appropriate. Insight and judgement are appropriate. Attention is focused, linear, and appropriate.  NEURO: CN grossly intact. Oriented as arrived to appointment on time with no prompting. Moves both UE equally.  SKIN: No obvious lesions, wounds, erythema, or cyanosis noted on face or hands.   Assessment and Plan:    ICD-10-CM   1. Acute non-recurrent maxillary sinusitis  J01.00        Follow Up Instructions: Treating with Augmentin, Mucinex, ibuprofen.  Instructed patient to follow-up with her PCP if her symptoms or not improving.  Patient agrees to plan of care.    I discussed the assessment and treatment plan with the patient. The patient was provided an opportunity to ask questions and all were answered. The patient agreed with the plan and demonstrated an understanding of the instructions.   The patient was advised to call back or seek an in-person evaluation if the symptoms worsen or if the condition fails to improve as anticipated.      Mickie Bail, NP  12/03/2019 10:12 AM         Mickie Bail, NP 12/03/19 1012

## 2020-02-16 ENCOUNTER — Telehealth: Payer: Self-pay

## 2020-02-16 NOTE — Telephone Encounter (Signed)
Pt calling triage c/o UTI symptoms, she is wanting to know if [facility] would be willing to send in antibiotic to start with. Pt at least needs a nurse visit so we can check urine. Can you please get this scheduled with pt

## 2020-02-16 NOTE — Telephone Encounter (Signed)
Patient is schedule for 02/17/20 at 11 am for nurse visit

## 2020-02-17 ENCOUNTER — Ambulatory Visit: Payer: Medicaid Other

## 2020-02-18 ENCOUNTER — Ambulatory Visit (INDEPENDENT_AMBULATORY_CARE_PROVIDER_SITE_OTHER): Payer: Medicaid Other | Admitting: Advanced Practice Midwife

## 2020-02-18 ENCOUNTER — Encounter: Payer: Self-pay | Admitting: Advanced Practice Midwife

## 2020-02-18 ENCOUNTER — Other Ambulatory Visit: Payer: Self-pay

## 2020-02-18 VITALS — BP 113/62 | HR 60 | Wt 160.0 lb

## 2020-02-18 DIAGNOSIS — N3000 Acute cystitis without hematuria: Secondary | ICD-10-CM | POA: Diagnosis not present

## 2020-02-18 DIAGNOSIS — L659 Nonscarring hair loss, unspecified: Secondary | ICD-10-CM

## 2020-02-18 DIAGNOSIS — R233 Spontaneous ecchymoses: Secondary | ICD-10-CM

## 2020-02-18 DIAGNOSIS — R238 Other skin changes: Secondary | ICD-10-CM | POA: Diagnosis not present

## 2020-02-18 DIAGNOSIS — R300 Dysuria: Secondary | ICD-10-CM | POA: Diagnosis not present

## 2020-02-18 DIAGNOSIS — R350 Frequency of micturition: Secondary | ICD-10-CM

## 2020-02-18 LAB — POCT URINALYSIS DIPSTICK
Bilirubin, UA: NEGATIVE
Blood, UA: NEGATIVE
Glucose, UA: NEGATIVE
Ketones, UA: NEGATIVE
Nitrite, UA: NEGATIVE
Protein, UA: POSITIVE — AB
Spec Grav, UA: 1.015 (ref 1.010–1.025)
Urobilinogen, UA: 0.2 E.U./dL
pH, UA: 6 (ref 5.0–8.0)

## 2020-02-18 NOTE — Progress Notes (Signed)
Patient ID: Diana [name], female   DOB: 07-Feb-1991, 29 y.o.   MRN: [id number]  Reason for Consult: Urinary Tract Infection (Frequent urination, right side pain) and Advice Only (discuss easy bruising)   [place] office Subjective:  HPI:  Diana [name] is a 29 y.o. female being seen for urinary discomfort,  frequency and urgency. A few days ago she began having symptoms. She took a medication for UTI symptoms (she does not know the name) given to her by a relative and had some relief. Then yesterday she started having bilateral flank pain. She denies tenderness mid back. She also mentions several other concerns including easy bruising- her legs bruise and she does not know what caused it, muscle pain and poor circulation in her legs, and hair loss since being pregnant. She denies personal or family history of bleeding disorder. She denies heavy menstrual bleeding. She admits inadequate hydration. She has a 35 month old and does not get 7-8 hours of sleep per night. We discussed; the effects stress can have on hormone function and adrenal fatigue, having blood work done, supplements that she can try, and other modes of therapy.   Past Medical History:  Diagnosis Date  . Abnormal Pap smear of cervix   . History of Papanicolaou smear of cervix 05/10/2015; [id number]   neg; neg  . Immunization, viral disease    gardasil completed   Family History  Problem Relation Age of Onset  . Diabetes Mother        Type 2  . Atrial fibrillation Father   . Diabetes Maternal Grandmother   . Atrial fibrillation Sister   . Aneurysm Brother        brain - half brother  . Cancer Paternal Grandfather        lung   Past Surgical History:  Procedure Laterality Date  . AUGMENTATION MAMMAPLASTY    . COLPOSCOPY  12/19/2010   lgsil    Short Social History:  Social History   Tobacco Use  . Smoking status: Never Smoker  . Smokeless tobacco: Never Used  Substance Use Topics  . Alcohol use: Not  Currently    Comment: occ    No Known Allergies  Current Outpatient Medications  Medication Sig Dispense Refill  . Norgestimate-Ethinyl Estradiol Triphasic (TRI-ESTARYLLA) 0.18/0.215/0.25 MG-35 MCG tablet      No current facility-administered medications for this visit.   Review of Systems  Constitutional: Negative for chills and fever.  HENT: Negative for congestion, ear discharge, ear pain, hearing loss, sinus pain and sore throat.   Eyes: Negative for blurred vision and double vision.  Respiratory: Negative for cough, shortness of breath and wheezing.   Cardiovascular: Negative for chest pain, palpitations and leg swelling.  Gastrointestinal: Negative for abdominal pain, blood in stool, constipation, diarrhea, heartburn, melena, nausea and vomiting.  Genitourinary: Positive for dysuria, frequency and urgency. Negative for flank pain and hematuria.  Musculoskeletal: Positive for myalgias. Negative for back pain and joint pain.  Skin: Negative for itching and rash.  Neurological: Negative for dizziness, tingling, tremors, sensory change, speech change, focal weakness, seizures, loss of consciousness, weakness and headaches.  Endo/Heme/Allergies: Negative for environmental allergies. Bruises/bleeds easily.       Positive for hair loss since pregnancy  Psychiatric/Behavioral: Negative for depression, hallucinations, memory loss, substance abuse and suicidal ideas. The patient is not nervous/anxious and does not have insomnia.         Objective:  Objective   Vitals:   02/18/20 0920  BP:  113/62  Pulse: 60  Weight: 160 lb (72.6 kg)   Body mass index is 23.63 kg/m. Constitutional: Well nourished, well developed female in no acute distress.  HEENT: normal Skin: Warm and dry.  Cardiovascular: Regular rate and rhythm.   Extremity: no edema  Respiratory: Clear to auscultation bilateral. Normal respiratory effort Abdomen: mild tenderness bilateral flanks Back: no CVAT Neuro:  DTRs 2+, Cranial nerves grossly intact Psych: Alert and Oriented x3. No memory deficits. Normal mood and affect.  MS: normal gait, normal bilateral lower extremity ROM/strength/stability.  Pelvic exam: deferred  Results for LUZMARIA, DEVAUX (MRN [id number]) as of 02/18/2020 14:57  Ref. Range 02/18/2020 09:28  Bilirubin, UA Unknown Negative  Clarity, UA Unknown Clear  Color, UA Unknown Straw  Glucose Latest Ref Range: Negative  Negative  Ketones, UA Unknown Negative  Leukocytes,UA Latest Ref Range: Negative  Trace (A)  Nitrite, UA Unknown Negative  pH, UA Latest Ref Range: 5.0 - 8.0  6.0  Protein,UA Latest Ref Range: Negative  Positive (A)  Specific Gravity, UA Latest Ref Range: 1.010 - 1.025  1.015  Urobilinogen, UA Latest Ref Range: 0.2 or 1.0 E.U./dL 0.2  RBC, UA Unknown Negative     Assessment/Plan:     29 y.o. G3 P65 female with possible urinary tract infection, possible vitamin/mineral deficiencies  Urine Culture  Blood work for: Bleeding disorder/anemia Thyroid function Metabolic panel  She is unable to wait at [employer] [facility] [facility] at time of visit and will return for labs later.   Some suggestions: Adrenal support: B vitamins including B12, Magnesium, Vitamin C, decrease stress Easy Bruising: Vitamin C with bioflavonoids Sore muscles: Magnesium supplement, Epsom salt soaks, hydration Poor circulation: hydration, walking, compression hose, massage, dark leafy greens, Omega 3 Fatty Acid supplement, B vitamins Hair Loss: Collagen or Gelatin supplement     [facility] 02/18/2020, 2:55 PM

## 2020-02-18 NOTE — Patient Instructions (Addendum)
Easy Bruising: Vitamin C with Bioflavonoids  Sore Muscles: Magnesium supplement, Epsom salt tub soaks and adequate hydration  Adrenal Support: (helps hormone function and overall well-being) B vitamins (all including B12), Magnesium, Vitamin C with Bioflavonoids  Circulation: B vitamins, dark leafy greens, Omega 3 fatty acids, hydration, walking, compression hose, massage

## 2020-02-19 LAB — COMPREHENSIVE METABOLIC PANEL
ALT: 14 IU/L (ref 0–32)
AST: 16 IU/L (ref 0–40)
Albumin/Globulin Ratio: 1.5 (ref 1.2–2.2)
Albumin: 4.3 g/dL (ref 3.9–5.0)
Alkaline Phosphatase: 80 IU/L (ref 48–121)
BUN/Creatinine Ratio: 21 (ref 9–23)
BUN: 16 mg/dL (ref 6–20)
Bilirubin Total: 0.3 mg/dL (ref 0.0–1.2)
CO2: 23 mmol/L (ref 20–29)
Calcium: 9.8 mg/dL (ref 8.7–10.2)
Chloride: 100 mmol/L (ref 96–106)
Creatinine, Ser: 0.78 mg/dL (ref 0.57–1.00)
GFR calc Af Amer: 118 mL/min/{1.73_m2} (ref >59)
GFR calc non Af Amer: 102 mL/min/{1.73_m2} (ref >59)
Globulin, Total: 2.9 g/dL (ref 1.5–4.5)
Glucose: 95 mg/dL (ref 65–99)
Potassium: 4 mmol/L (ref 3.5–5.2)
Sodium: 137 mmol/L (ref 134–144)
Total Protein: 7.2 g/dL (ref 6.0–8.5)

## 2020-02-19 LAB — CBC WITH DIFFERENTIAL/PLATELET
Basophils Absolute: 0 10*3/uL (ref 0.0–0.2)
Basos: 0 %
EOS (ABSOLUTE): 0.1 10*3/uL (ref 0.0–0.4)
Eos: 1 %
Hematocrit: 39.4 % (ref 34.0–46.6)
Hemoglobin: 12.7 g/dL (ref 11.1–15.9)
Immature Grans (Abs): 0 10*3/uL (ref 0.0–0.1)
Immature Granulocytes: 0 %
Lymphocytes Absolute: 2.9 10*3/uL (ref 0.7–3.1)
Lymphs: 35 %
MCH: 29.8 pg (ref 26.6–33.0)
MCHC: 32.2 g/dL (ref 31.5–35.7)
MCV: 93 fL (ref 79–97)
Monocytes Absolute: 0.4 10*3/uL (ref 0.1–0.9)
Monocytes: 5 %
Neutrophils Absolute: 4.9 10*3/uL (ref 1.4–7.0)
Neutrophils: 59 %
Platelets: 238 10*3/uL (ref 150–450)
RBC: 4.26 x10E6/uL (ref 3.77–5.28)
RDW: 11.9 % (ref 11.7–15.4)
WBC: 8.3 10*3/uL (ref 3.4–10.8)

## 2020-02-19 LAB — PT AND PTT
INR: 0.9 (ref 0.9–1.2)
Prothrombin Time: 10.2 s (ref 9.1–12.0)
aPTT: 26 s (ref 24–33)

## 2020-02-19 LAB — TSH: TSH: 1.67 u[IU]/mL (ref 0.450–4.500)

## 2020-02-20 ENCOUNTER — Other Ambulatory Visit: Payer: Self-pay | Admitting: Advanced Practice Midwife

## 2020-02-20 DIAGNOSIS — R300 Dysuria: Secondary | ICD-10-CM

## 2020-02-20 LAB — URINE CULTURE

## 2020-02-20 MED ORDER — CEPHALEXIN 500 MG PO CAPS: 500.0000 mg | ORAL_CAPSULE | Freq: Three times a day (TID) | ORAL | 0 refills | Status: AC

## 2020-02-20 NOTE — Progress Notes (Unsigned)
Rx keflex sent for UTI symptoms.

## 2020-05-12 ENCOUNTER — Ambulatory Visit
Admission: RE | Admit: 2020-05-12 | Discharge: 2020-05-12 | Disposition: A | Discharge status: Home / Self Care | Payer: Medicaid Other | Source: Ambulatory Visit | Attending: Emergency Medicine | Admitting: Emergency Medicine

## 2020-05-12 ENCOUNTER — Other Ambulatory Visit: Payer: Self-pay

## 2020-05-12 VITALS — BP 122/77 | HR 62 | Temp 98.8°F | Resp 18 | Ht 69.0 in | Wt 155.0 lb

## 2020-05-12 DIAGNOSIS — J069 Acute upper respiratory infection, unspecified: Secondary | ICD-10-CM

## 2020-05-12 MED ORDER — IBUPROFEN 600 MG PO TABS: 600.0000 mg | ORAL_TABLET | Freq: Four times a day (QID) | ORAL | 0 refills | Status: DC | PRN

## 2020-05-12 MED ORDER — AMOXICILLIN-POT CLAVULANATE 875-125 MG PO TABS: 1.0000 | ORAL_TABLET | Freq: Two times a day (BID) | ORAL | 0 refills | Status: DC

## 2020-05-12 MED ORDER — FLUTICASONE PROPIONATE 50 MCG/ACT NA SUSP: 2.0000 | Freq: Every day | NASAL | 0 refills | Status: DC

## 2020-05-12 NOTE — ED Triage Notes (Addendum)
Patient c/o nasal congestion and pressure and mild cough that started 5 days ago. Patient denies fever. Declines COVID testing.

## 2020-05-12 NOTE — ED Provider Notes (Signed)
HPI  SUBJECTIVE:  Diana [name] is a 29 y.o. female who presents with a worsening frontal sinus headache, sinus pain and pressure today.  She reports increased nasal clear rhinorrhea.  She reports right ear fullness, muffled hearing, postnasal drip, sore throat and cough secondary to postnasal drip.  She states that she has been "getting over a cold" for the past 5 or 6 days.  Both of her girls have RSV.  She denies fevers, facial swelling, upper dental pain.  No body aches, loss of sense of smell or taste, shortness of breath, nausea, vomiting, diarrhea, abdominal pain, known Covid exposure.  She did not get the Covid vaccine.  She took ibuprofen 400 mg before coming here with improvement in her symptoms.  She has also tried Mucinex with significant improvement in her symptoms.  She has also tried NyQuil and DayQuil.  Symptoms worse with bending forward.  She has a past medical history of frequent sinusitis, otitis media.  No history of diabetes, hypertension.  LMP: 2 to 3 weeks ago.  Denies the possibility being pregnant.  PMD: [facility].    Past Medical History:  Diagnosis Date  . Abnormal Pap smear of cervix   . History of Papanicolaou smear of cervix 05/10/2015; [id number]   neg; neg  . Immunization, viral disease    gardasil completed    Past Surgical History:  Procedure Laterality Date  . AUGMENTATION MAMMAPLASTY    . COLPOSCOPY  12/19/2010   lgsil    Family History  Problem Relation Age of Onset  . Diabetes Mother        Type 2  . Atrial fibrillation Father   . Diabetes Maternal Grandmother   . Atrial fibrillation Sister   . Aneurysm Brother        brain - half brother  . Cancer Paternal Grandfather        lung    Social History   Tobacco Use  . Smoking status: Never Smoker  . Smokeless tobacco: Never Used  Vaping Use  . Vaping Use: Never used  Substance Use Topics  . Alcohol use: Not Currently    Comment: occ  . Drug use: No    No current  facility-administered medications for this encounter.  Current Outpatient Medications:  .  Norgestimate-Ethinyl Estradiol Triphasic (TRI-ESTARYLLA) 0.18/0.215/0.25 MG-35 MCG tablet, , Disp: , Rfl:  .  amoxicillin-clavulanate (AUGMENTIN) 875-125 MG tablet, Take 1 tablet by mouth 2 (two) times daily. X 7 days, Disp: 14 tablet, Rfl: 0 .  fluticasone (FLONASE) 50 MCG/ACT nasal spray, Place 2 sprays into both nostrils daily., Disp: 16 g, Rfl: 0 .  ibuprofen (ADVIL) 600 MG tablet, Take 1 tablet (600 mg total) by mouth every 6 (six) hours as needed., Disp: 30 tablet, Rfl: 0  No Known Allergies   ROS  As noted in HPI.   Physical Exam  BP 122/77 (BP Location: Right Arm)   Pulse 62   Temp 98.8 F (37.1 C) (Oral)   Resp 18   Ht 5\' 9"  (1.753 m)   Wt 70.3 kg   SpO2 98%   BMI 22.89 kg/m   Constitutional: Well developed, well nourished, no acute distress Eyes:  EOMI, conjunctiva normal bilaterally HENT: Normocephalic, atraumatic,mucus membranes moist. TMs normal bilaterally. No apparent traction on pinna, palpation of tragus, palpation of mastoid right side. No TMJ tenderness right side. +  clear nasal congestion. Swollen  red turbinates. - maxillary sinus tenderness, - frontal sinus tenderness. Oropharynx erythematous tonsils normal size without  exudates, uvula midline, + postnasal drip, cobblestoning  Neck: Positive shotty cervical lymphadenopathy Respiratory: Normal inspiratory effort lungs clear bilaterally Cardiovascular: Normal rate regular rhythm no murmurs rubs or gallops GI: nondistended, nontender, no splenomegaly skin: No rash, skin intact Musculoskeletal: no deformities Neurologic: Alert & oriented x 3, no focal neuro deficits Psychiatric: Speech and behavior appropriate   ED Course   Medications - No data to display  No orders of the defined types were placed in this encounter.   No results found for this or any previous visit (from the past 24 hour(s)). No results  found.  ED Clinical Impression  Viral upper respiratory tract infection   ED Assessment/Plan  Patient declined Covid test  No fevers >102, has had sx for < 10 days, no h/o double sickening. No historical or objective evidence of bacterial infection. No indication for abx. Will start nasal steriods, mucinex-d, increase fluids, nasal saline irrigation,  tylenol/motrin prn pain. Benadryl/Maalox mixture for sore throat but I suspect sore throat is from postnasal drip and nasal congestion. Will send home with a wait-and-see prescription of 7-day Augmentin as she is going out of town to [place] and states that she will not have easy access to medical care. Discussed with her to not fill this for another 5 days. She is amenable to this. Discussed MDM and plan with pt.. Pt agrees with plan.  *This clinic note was created using Dragon dictation software. Therefore, there may be occasional mistakes despite careful proofreading.  ?     Domenick Gong, MD 05/12/20 2034

## 2020-05-12 NOTE — Discharge Instructions (Addendum)
Start the [facility] for nasal congestion. Start Mucinex-D to keep the mucous thin and to decongest you.  Return to the ER if you get worse, have a fever >100.4, or for any concerns. You may take 600 mg of motrin with 1 gram of tylenol up to 3-4 times a day as needed for pain. This is an effective combination for pain.  Most sinus infections are viral and do not need antibiotics unless you have a high fever, have had this for 10 days, or you get better and then get sick again. Use a NeilMed sinus rinse with distilled water as often as you want to to reduce nasal congestion. Follow the directions on the box. Wait another 5 days to fill the Augmentin.  For your sore throat: Take 5 mL of liquid Benadryl and 5 mL of Maalox. Mix it together, and then hold it in your mouth for as long as you can and then swallow. You may do this 4 times a day.      Go to www.goodrx.com to look up your medications. This will give you a list of where you can find your prescriptions at the most affordable prices. Or you can ask the pharmacist what the cash price is. This is frequently cheaper than going through insurance.

## 2020-05-26 ENCOUNTER — Other Ambulatory Visit: Payer: Self-pay

## 2020-05-26 ENCOUNTER — Ambulatory Visit
Admission: RE | Admit: 2020-05-26 | Discharge: 2020-05-26 | Disposition: A | Discharge status: Home / Self Care | Payer: Medicaid Other | Source: Ambulatory Visit | Attending: Family Medicine | Admitting: Family Medicine

## 2020-05-26 VITALS — BP 124/80 | HR 73 | Temp 99.4°F | Resp 14 | Ht 69.0 in | Wt 155.0 lb

## 2020-05-26 DIAGNOSIS — B9789 Other viral agents as the cause of diseases classified elsewhere: Secondary | ICD-10-CM

## 2020-05-26 DIAGNOSIS — Z20822 Contact with and (suspected) exposure to covid-19: Secondary | ICD-10-CM

## 2020-05-26 DIAGNOSIS — U071 COVID-19: Secondary | ICD-10-CM | POA: Diagnosis not present

## 2020-05-26 DIAGNOSIS — J988 Other specified respiratory disorders: Secondary | ICD-10-CM

## 2020-05-26 LAB — SARS CORONAVIRUS 2 (TAT 6-24 HRS): SARS Coronavirus 2: POSITIVE — AB

## 2020-05-26 NOTE — ED Provider Notes (Signed)
[facility]    CSN: [id number] Arrival date & time: 05/26/20  1149      History   Chief Complaint Chief Complaint  Patient presents with  . Cough  . Nasal Congestion   HPI   29 year old female presents with concerns for COVID-19.  Patient reports that her mother has recently been diagnosed with COVID-19.  She has been around her.  Patient states that she developed symptoms on Friday.  She reports fever, chills, mild cough, and congestion.  She states that she has had some chest tightness/soreness presumably due to the cough.  Patient reports that her fever has now resolved.  She is now most bothered by frontal headache and feeling as if her eyes are "sensitive".  No relieving factors.  She has taken Tylenol without relief.  No other associated symptoms.  No other complaints.  Past Medical History:  Diagnosis Date  . Abnormal Pap smear of cervix   . History of Papanicolaou smear of cervix 05/10/2015; [id number]   neg; neg  . Immunization, viral disease    gardasil completed    Patient Active Problem List   Diagnosis Date Noted  . Labor and delivery, indication for care 05/17/2019  . Postpartum care following vaginal delivery 05/17/2019  . Erroneous encounter - disregard 05/12/2019  . Supervision of other normal pregnancy, antepartum 10/09/2018    Past Surgical History:  Procedure Laterality Date  . AUGMENTATION MAMMAPLASTY    . COLPOSCOPY  12/19/2010   lgsil    OB History    Gravida  3   Para  2   Term  2   Preterm      AB  1   Living  2     SAB      TAB  1   Ectopic      Multiple  0   Live Births  2            Home Medications    Prior to Admission medications   Medication Sig Start Date End Date Taking? Authorizing Provider  fluticasone (FLONASE) 50 MCG/ACT nasal spray Place 2 sprays into both nostrils daily. 05/12/20  Yes Domenick Gong, MD  ibuprofen (ADVIL) 600 MG tablet Take 1 tablet (600 mg total) by mouth every 6 (six)  hours as needed. 05/12/20  Yes Domenick Gong, MD  Norgestimate-Ethinyl Estradiol Triphasic (TRI-ESTARYLLA) 0.18/0.215/0.25 MG-35 MCG tablet  05/27/19  Yes [provider]  amoxicillin-clavulanate (AUGMENTIN) 875-125 MG tablet Take 1 tablet by mouth 2 (two) times daily. X 7 days 05/12/20   Domenick Gong, MD    Family History Family History  Problem Relation Age of Onset  . Diabetes Mother        Type 2  . Atrial fibrillation Father   . Diabetes Maternal Grandmother   . Atrial fibrillation Sister   . Aneurysm Brother        brain - half brother  . Cancer Paternal Grandfather        lung    Social History Social History   Tobacco Use  . Smoking status: Never Smoker  . Smokeless tobacco: Never Used  Vaping Use  . Vaping Use: Never used  Substance Use Topics  . Alcohol use: Not Currently    Comment: occ  . Drug use: No     Allergies   Patient has no known allergies.   Review of Systems Review of Systems  Constitutional: Positive for fever.  HENT: Positive for congestion.   Respiratory: Positive for cough.  Neurological: Positive for headaches.   Physical Exam Triage Vital Signs ED Triage Vitals  Enc Vitals Group     BP 05/26/20 1215 124/80     Pulse Rate 05/26/20 1215 73     Resp 05/26/20 1215 14     Temp 05/26/20 1215 99.4 F (37.4 C)     Temp Source 05/26/20 1215 Oral     SpO2 05/26/20 1215 99 %     Weight 05/26/20 1218 155 lb (70.3 kg)     Height 05/26/20 1218 5\' 9"  (1.753 m)     Head Circumference --      Peak Flow --      Pain Score 05/26/20 1216 6     Pain Loc --      Pain Edu? --      Excl. in GC? --    Updated Vital Signs BP 124/80 (BP Location: Left Arm)   Pulse 73   Temp 99.4 F (37.4 C) (Oral)   Resp 14   Ht 5\' 9"  (1.753 m)   Wt 70.3 kg   LMP 05/26/2020   SpO2 99%   BMI 22.89 kg/m   Visual Acuity Right Eye Distance:   Left Eye Distance:   Bilateral Distance:    Right Eye Near:   Left Eye Near:    Bilateral  Near:     Physical Exam Vitals and nursing note reviewed.  Constitutional:      General: She is not in acute distress.    Appearance: Normal appearance. She is not ill-appearing.  HENT:     Head: Normocephalic and atraumatic.     Right Ear: Tympanic membrane normal.     Left Ear: Tympanic membrane normal.     Mouth/Throat:     Pharynx: Oropharynx is clear. No oropharyngeal exudate or posterior oropharyngeal erythema.  Eyes:     General:        Right eye: No discharge.        Left eye: No discharge.     Conjunctiva/sclera: Conjunctivae normal.  Cardiovascular:     Rate and Rhythm: Normal rate and regular rhythm.     Heart sounds: No murmur heard.   Pulmonary:     Effort: Pulmonary effort is normal.     Breath sounds: Normal breath sounds. No wheezing, rhonchi or rales.  Neurological:     Mental Status: She is alert.  Psychiatric:        Mood and Affect: Mood normal.        Behavior: Behavior normal.    UC Treatments / Results  Labs (all labs ordered are listed, but only abnormal results are displayed) Labs Reviewed  SARS CORONAVIRUS 2 (TAT 6-24 HRS)    EKG   Radiology No results found.  Procedures Procedures (including critical care time)  Medications Ordered in UC Medications - No data to display  Initial Impression / Assessment and Plan / UC Course  I have reviewed the triage vital signs and the nursing notes.  Pertinent labs & imaging results that were available during my care of the patient were reviewed by me and considered in my medical decision making (see chart for details).    29 year old female presents with a viral respiratory infection.  Suspected COVID-19.  Awaiting test results.  Advised over-the-counter Sudafed or Zyrtec-D/Claritin-D for symptomatic treatment.  Ibuprofen as needed for headache.  Supportive care.  Final Clinical Impressions(s) / UC Diagnoses   Final diagnoses:  Viral respiratory infection  Suspected COVID-19 virus infection  Discharge Instructions     Fluids.  Sudafed or Zyrtec D/Claritin D.   Stay home.  Take care  Dr. Adriana Simas     ED Prescriptions    None     PDMP not reviewed this encounter.   Tommie Sams, [place] 05/26/20 1258

## 2020-05-26 NOTE — Discharge Instructions (Signed)
Fluids.  Sudafed or Zyrtec D/Claritin D.   Stay home.  Take care  Dr. Adriana Simas

## 2020-05-29 ENCOUNTER — Ambulatory Visit (INDEPENDENT_AMBULATORY_CARE_PROVIDER_SITE_OTHER): Payer: Medicaid Other

## 2020-05-29 ENCOUNTER — Ambulatory Visit
Admission: RE | Admit: 2020-05-29 | Discharge: 2020-05-29 | Disposition: A | Discharge status: Home / Self Care | Payer: Medicaid Other | Source: Ambulatory Visit | Attending: Physician Assistant | Admitting: Physician Assistant

## 2020-05-29 ENCOUNTER — Other Ambulatory Visit: Payer: Self-pay

## 2020-05-29 VITALS — BP 124/84 | HR 75 | Temp 98.4°F | Resp 14 | Ht 69.0 in | Wt 130.0 lb

## 2020-05-29 DIAGNOSIS — R0602 Shortness of breath: Secondary | ICD-10-CM

## 2020-05-29 DIAGNOSIS — U071 COVID-19: Secondary | ICD-10-CM | POA: Diagnosis not present

## 2020-05-29 DIAGNOSIS — R0781 Pleurodynia: Secondary | ICD-10-CM | POA: Diagnosis not present

## 2020-05-29 NOTE — ED Triage Notes (Signed)
Patient c/o chest pain when she takes a deep breath or when she cough since Wed.  Patient was diagnosed with COVID on 05/26/20.

## 2020-05-29 NOTE — Discharge Instructions (Addendum)

## 2020-05-29 NOTE — ED Provider Notes (Signed)
[facility]    CSN: [id number] Arrival date & time: 05/29/20  1318      History   Chief Complaint Chief Complaint  Patient presents with  . Covid Positive  . Chest Pain  . Cough    HPI Diana [name] is a 29 y.o. female.    29 year old female who recently has a positive for Covid 3 days ago presents for chest pain with coughing.  Symptoms have been ongoing for 8 days.  Denies fever.  Admits to some continued fatigue.  Denies headaches, sore throat, congestion,.  She has continued loss of taste and smell.  Patient denies fever chest pain denies chest pain when she is not coughing.  She denies shortness of breath.  Has no history of cardiopulmonary disease.  She has been taking over-the-counter cough medication.  She says that her symptoms are actually better today, but she is anxious and just wants to make sure there is nothing serious going on. No other concerns today.     Past Medical History:  Diagnosis Date  . Abnormal Pap smear of cervix   . History of Papanicolaou smear of cervix 05/10/2015; [id number]   neg; neg  . Immunization, viral disease    gardasil completed    Patient Active Problem List   Diagnosis Date Noted  . Labor and delivery, indication for care 05/17/2019  . Postpartum care following vaginal delivery 05/17/2019  . Erroneous encounter - disregard 05/12/2019  . Supervision of other normal pregnancy, antepartum 10/09/2018    Past Surgical History:  Procedure Laterality Date  . AUGMENTATION MAMMAPLASTY    . COLPOSCOPY  12/19/2010   lgsil    OB History    Gravida  3   Para  2   Term  2   Preterm      AB  1   Living  2     SAB      TAB  1   Ectopic      Multiple  0   Live Births  2            Home Medications    Prior to Admission medications   Medication Sig Start Date End Date Taking? Authorizing Provider  Norgestimate-Ethinyl Estradiol Triphasic (TRI-ESTARYLLA) 0.18/0.215/0.25 MG-35 MCG tablet  05/27/19   Yes [provider]  amoxicillin-clavulanate (AUGMENTIN) 875-125 MG tablet Take 1 tablet by mouth 2 (two) times daily. X 7 days 05/12/20   Domenick GongMortenson, Ashley, MD  fluticasone [facility](FLONASE) 50 MCG/ACT nasal spray Place 2 sprays into both nostrils daily. 05/12/20   Domenick GongMortenson, Ashley, MD  ibuprofen (ADVIL) 600 MG tablet Take 1 tablet (600 mg total) by mouth every 6 (six) hours as needed. 05/12/20   Domenick GongMortenson, Ashley, MD    Family History Family History  Problem Relation Age of Onset  . Diabetes Mother        Type 2  . Atrial fibrillation Father   . Diabetes Maternal Grandmother   . Atrial fibrillation Sister   . Aneurysm Brother        brain - half brother  . Cancer Paternal Grandfather        lung    Social History Social History   Tobacco Use  . Smoking status: Never Smoker  . Smokeless tobacco: Never Used  Vaping Use  . Vaping Use: Never used  Substance Use Topics  . Alcohol use: Not Currently    Comment: occ  . Drug use: No     Allergies   Patient  has no known allergies.   Review of Systems Review of Systems  Constitutional: Positive for fatigue. Negative for chills, diaphoresis and fever.  HENT: Negative for congestion, ear pain, rhinorrhea, sinus pressure, sinus pain and sore throat.   Respiratory: Positive for cough. Negative for chest tightness, shortness of breath and wheezing.   Cardiovascular: Positive for chest pain. Negative for palpitations and leg swelling.  Gastrointestinal: Negative for abdominal pain, nausea and vomiting.  Musculoskeletal: Negative for arthralgias and myalgias.  Skin: Negative for rash.  Neurological: Negative for weakness and headaches.  Hematological: Negative for adenopathy.     Physical Exam Triage Vital Signs ED Triage Vitals  Enc Vitals Group     BP 05/29/20 1405 124/84     Pulse Rate 05/29/20 1405 75     Resp 05/29/20 1405 14     Temp 05/29/20 1405 98.4 F (36.9 C)     Temp Source 05/29/20 1405 Oral     SpO2  05/29/20 1405 100 %     Weight 05/29/20 1402 130 lb (59 kg)     Height 05/29/20 1402 5\' 9"  (1.753 m)     Head Circumference --      Peak Flow --      Pain Score 05/29/20 1401 8     Pain Loc --      Pain Edu? --      Excl. in GC? --    No data found.  Updated Vital Signs BP 124/84 (BP Location: Right Arm)   Pulse 75   Temp 98.4 F (36.9 C) (Oral)   Resp 14   Ht 5\' 9"  (1.753 m)   Wt 130 lb (59 kg)   LMP 05/26/2020   SpO2 100%   Breastfeeding No   BMI 19.20 kg/m      Physical Exam Vitals and nursing note reviewed.  Constitutional:      General: She is not in acute distress.    Appearance: Normal appearance. She is well-developed. She is not ill-appearing or toxic-appearing.  HENT:     Head: Normocephalic and atraumatic.     Nose: Nose normal.     Mouth/Throat:     Mouth: Mucous membranes are moist.     Pharynx: Oropharynx is clear.  Eyes:     General: No scleral icterus.       Right eye: No discharge.        Left eye: No discharge.     Conjunctiva/sclera: Conjunctivae normal.  Cardiovascular:     Rate and Rhythm: Normal rate and regular rhythm.     Heart sounds: Normal heart sounds.  Pulmonary:     Effort: Pulmonary effort is normal. No respiratory distress.     Breath sounds: Normal breath sounds. No wheezing, rhonchi or rales.  Musculoskeletal:     Cervical back: Neck supple.     Right lower leg: No edema.     Left lower leg: No edema.  Skin:    General: Skin is dry.  Neurological:     General: No focal deficit present.     Mental Status: She is alert. Mental status is at baseline.     Motor: No weakness.     Gait: Gait normal.  Psychiatric:        Mood and Affect: Mood normal.        Behavior: Behavior normal.        Thought Content: Thought content normal.      UC Treatments / Results  Labs (all labs ordered are  listed, but only abnormal results are displayed) Labs Reviewed - No data to display  EKG   Radiology DG Chest 2 View  Result  Date: 05/29/2020 CLINICAL DATA:  Shortness of breath.  COVID. EXAM: CHEST - 2 VIEW COMPARISON:  None. FINDINGS: Heart size and mediastinal contours are normal. Lungs are clear. No pleural effusion or pneumothorax is seen. Osseous structures about the chest are unremarkable. IMPRESSION: No active cardiopulmonary disease.  No evidence of pneumonia. Electronically Signed   By: Bary Richard M.D.   On: 05/29/2020 14:18    Procedures Procedures (including critical care time)  Medications Ordered in UC Medications - No data to display  Initial Impression / Assessment and Plan / UC Course  I have reviewed the triage vital signs and the nursing notes.  Pertinent labs & imaging results that were available during my care of the patient were reviewed by me and considered in my medical decision making (see chart for details).   Chest x-ray ordered and reviewed by me today.  Chest x-ray is normal.  There is no evidence of pneumonia, rib fracture, or other cardiopulmonary disease.  Discussed result with patient.  Advised her pleuritic pain likely due to frequent coughing she was having before and should improve over the next few days since her cough is already getting better.  Advised Motrin and Tylenol.  Also advised continue to rest and increase fluid intake.  Advised her to go to ED for any return of fever, increased chest pain, or if she ever has any shortness of breath or weakness.  Patient says she does think some of this is related to her anxiety and feels little bit better now after having the chest x-ray. She declines AVS.  Final Clinical Impressions(s) / UC Diagnoses   Final diagnoses:  COVID-19  Pleuritic pain     Discharge Instructions     You have received COVID testing today either for positive exposure, concerning symptoms that could be related to COVID infection, screening purposes, or re-testing after confirmed positive.  Your test obtained today checks for active viral infection in the  last 1-2 weeks. If your test is negative now, you can still test positive later. So, if you do develop symptoms you should either get re-tested and/or isolate x 10 days. Please follow CDC guidelines.  While Rapid antigen tests come back in 15-20 minutes, send out PCR/molecular test results typically come back within 24 hours. In the mean time, if you are symptomatic, assume this could be a positive test and treat/monitor yourself as if you do have COVID.   We will call with test results. Please download the MyChart app and set up a profile to access test results.   If symptomatic, go home and rest. Push fluids. Take Tylenol as needed for discomfort. Gargle warm salt water. Throat lozenges. Take Mucinex DM or Robitussin for cough. Humidifier in bedroom to ease coughing. Warm showers. Also review the COVID handout for more information.  COVID-19 INFECTION: The incubation period of COVID-19 is approximately 14 days after exposure, with most symptoms developing in roughly 4-5 days. Symptoms may range in severity from mild to critically severe. Roughly 80% of those infected will have mild symptoms. People of any age may become infected with COVID-19 and have the ability to transmit the virus. The most common symptoms include: fever, fatigue, cough, body aches, headaches, sore throat, nasal congestion, shortness of breath, nausea, vomiting, diarrhea, changes in smell and/or taste.    COURSE OF ILLNESS Some patients  may begin with mild disease which can progress quickly into critical symptoms. If your symptoms are worsening please call ahead to the Emergency Department and proceed there for further treatment. Recovery time appears to be roughly 1-2 weeks for mild symptoms and 3-6 weeks for severe disease.   GO IMMEDIATELY TO ER FOR FEVER YOU ARE UNABLE TO GET DOWN WITH TYLENOL, BREATHING PROBLEMS, CHEST PAIN, FATIGUE, LETHARGY, INABILITY TO EAT OR DRINK, ETC  QUARANTINE AND ISOLATION: To help decrease the  spread of COVID-19 please remain isolated if you have COVID infection or are highly suspected to have COVID infection. This means -stay home and isolate to one room in the home if you live with others. Do not share a bed or bathroom with others while ill, sanitize and wipe down all countertops and keep common areas clean and disinfected. You may discontinue isolation if you have a mild case and are asymptomatic 10 days after symptom onset as long as you have been fever free >24 hours without having to take Motrin or Tylenol. If your case is more severe (meaning you develop pneumonia or are admitted in the hospital), you may have to isolate longer.   If you have been in close contact (within 6 feet) of someone diagnosed with COVID 19, you are advised to quarantine in your home for 14 days as symptoms can develop anywhere from 2-14 days after exposure to the virus. If you develop symptoms, you  must isolate.  Most current guidelines for COVID after exposure -isolate 10 days if you ARE NOT tested for COVID as long as symptoms do not develop -isolate 7 days if you are tested and remain asymptomatic -You do not necessarily need to be tested for COVID if you have + exposure and        develop   symptoms. Just isolate at home x10 days from symptom onset During this global pandemic, CDC advises to practice social distancing, try to stay at least 43ft away from others at all times. Wear a face covering. Wash and sanitize your hands regularly and avoid going anywhere that is not necessary.  KEEP IN MIND THAT THE COVID TEST IS NOT 100% ACCURATE AND YOU SHOULD STILL DO EVERYTHING TO PREVENT POTENTIAL SPREAD OF VIRUS TO OTHERS (WEAR MASK, WEAR GLOVES, WASH HANDS AND SANITIZE REGULARLY). IF INITIAL TEST IS NEGATIVE, THIS MAY NOT MEAN YOU ARE DEFINITELY NEGATIVE. MOST ACCURATE TESTING IS DONE 5-7 DAYS AFTER EXPOSURE.   It is not advised by CDC to get re-tested after receiving a positive COVID test since you can still  test positive for weeks to months after you have already cleared the virus.   *If you have not been vaccinated for COVID, I strongly suggest you consider getting vaccinated as long as there are no contraindications.      ED Prescriptions    None     PDMP not reviewed this encounter.   Shirlee Latch, PA-C 05/29/20 1451

## 2020-06-01 ENCOUNTER — Telehealth: Payer: Self-pay | Admitting: Internal Medicine

## 2020-06-01 NOTE — Telephone Encounter (Signed)
Called to Discuss with patient about Covid symptoms and the use of the monoclonal antibody infusion for those with mild to moderate Covid symptoms and at a high risk of hospitalization.     Unfortunately patient does not qualify for Mab infusion as she is on day 11 of symptoms and no qualifying risk factors.  Patient states she is actually feeling better today but now with nausea.  Patient requesting something for nausea.  Will call in Zofran as needed to pharmacy.  Patient instructed to follow-up at urgent care or with PCP with any worsening symptoms.  Marcy Salvo, NP [facility]

## 2020-07-15 ENCOUNTER — Other Ambulatory Visit: Payer: Self-pay | Admitting: Advanced Practice Midwife

## 2020-07-15 DIAGNOSIS — Z3041 Encounter for surveillance of contraceptive pills: Secondary | ICD-10-CM

## 2020-07-28 ENCOUNTER — Other Ambulatory Visit: Payer: Self-pay

## 2020-07-28 DIAGNOSIS — Z3041 Encounter for surveillance of contraceptive pills: Secondary | ICD-10-CM

## 2020-07-28 MED ORDER — NORGESTIM-ETH ESTRAD TRIPHASIC 0.18/0.215/0.25 MG-35 MCG PO TABS: 1.0000 | ORAL_TABLET | Freq: Every day | ORAL | 4 refills | Status: DC

## 2020-08-06 ENCOUNTER — Encounter: Payer: Self-pay | Admitting: Advanced Practice Midwife

## 2020-08-06 ENCOUNTER — Other Ambulatory Visit (HOSPITAL_COMMUNITY)
Admission: RE | Admit: 2020-08-06 | Discharge: 2020-08-06 | Disposition: A | Discharge status: Home / Self Care | Payer: Medicaid Other | Source: Ambulatory Visit | Attending: Advanced Practice Midwife | Admitting: Advanced Practice Midwife

## 2020-08-06 ENCOUNTER — Other Ambulatory Visit: Payer: Self-pay

## 2020-08-06 ENCOUNTER — Ambulatory Visit (INDEPENDENT_AMBULATORY_CARE_PROVIDER_SITE_OTHER): Payer: Medicaid Other | Admitting: Advanced Practice Midwife

## 2020-08-06 VITALS — BP 110/68 | Ht 69.0 in | Wt 154.4 lb

## 2020-08-06 DIAGNOSIS — Z3041 Encounter for surveillance of contraceptive pills: Secondary | ICD-10-CM

## 2020-08-06 DIAGNOSIS — Z01419 Encounter for gynecological examination (general) (routine) without abnormal findings: Secondary | ICD-10-CM

## 2020-08-06 DIAGNOSIS — N898 Other specified noninflammatory disorders of vagina: Secondary | ICD-10-CM

## 2020-08-06 MED ORDER — NORGESTIM-ETH ESTRAD TRIPHASIC 0.18/0.215/0.25 MG-35 MCG PO TABS: 1.0000 | ORAL_TABLET | Freq: Every day | ORAL | 4 refills | Status: DC

## 2020-08-06 NOTE — Progress Notes (Signed)
Pt here for annual exam.

## 2020-08-06 NOTE — Progress Notes (Signed)
Gynecology Annual Exam   PCP: Patient, No Pcp Per  Chief Complaint:  Chief Complaint  Patient presents with  . Gynecologic Exam    Annual Exam    History of Present Illness: Patient is a 29 y.o. [id number] presents for annual exam. The patient has complaint today of occasional vaginal discharge which she thinks is normal, however, she would like to be tested for yeast/BV. She denies any itching, irritation, odor or burning. She mentions that her hair continues to thin. I reminded her to try the gelatin or bone broth supplement that we discussed at a previous visit.    LMP: Patient's last menstrual period was 07/22/2020. Average Interval: regular, 28 days Duration of flow: 4-6 days Heavy Menses: no Clots: no Intermenstrual Bleeding: no Postcoital Bleeding: no Dysmenorrhea: no  The patient is sexually active. She currently uses OCP (estrogen/progesterone) for contraception. She denies dyspareunia.  The patient does perform self breast exams.  There is no notable family history of breast or ovarian cancer in her family.  The patient wears seatbelts: yes.   The patient has regular exercise: She is active at her job and with her children. She admits a healthy diet, adequate hydration and adequate sleep.    The patient denies current symptoms of depression. 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 She is searching for a different job.   Review of Systems: Review of Systems  Constitutional: Negative for chills and fever.  HENT: Negative for congestion, ear discharge, ear pain, hearing loss, sinus pain and sore throat.   Eyes: Negative for blurred vision and double vision.  Respiratory: Negative for cough, shortness of breath and wheezing.   Cardiovascular: Negative for chest pain, palpitations and leg swelling.  Gastrointestinal: Negative for abdominal pain, blood in stool, constipation, diarrhea, heartburn, melena, nausea and  vomiting.  Genitourinary: Negative for dysuria, flank pain, frequency, hematuria and urgency.       Positive for vaginal discharge  Musculoskeletal: Negative for back pain, joint pain and myalgias.  Skin: Negative for itching and rash.  Neurological: Negative for dizziness, tingling, tremors, sensory change, speech change, focal weakness, seizures, loss of consciousness, weakness and headaches.  Endo/Heme/Allergies: Negative for environmental allergies. Does not bruise/bleed easily.       Positive for thinning hair  Psychiatric/Behavioral: Negative for depression, hallucinations, memory loss, substance abuse and suicidal ideas. The patient is not nervous/anxious and does not have insomnia.     Past Medical History:  Patient Active Problem List   Diagnosis Date Noted  . Erroneous encounter - disregard 05/12/2019    Past Surgical History:  Past Surgical History:  Procedure Laterality Date  . AUGMENTATION MAMMAPLASTY    . COLPOSCOPY  12/19/2010   lgsil    Gynecologic History:  Patient's last menstrual period was 07/22/2020. Contraception: OCP (estrogen/progesterone) Last Pap: 2 years ago Results were: no abnormalities   Obstetric History: [id number]  Family History:  Family History  Problem Relation Age of Onset  . Diabetes Mother        Type 2  . Atrial fibrillation Father   . Diabetes Maternal Grandmother   . Atrial fibrillation Sister   . Aneurysm Brother        brain - half brother  . Cancer Paternal Grandfather        lung    Social History:  Social History   Socioeconomic History  . Marital status: Single    Spouse name: Not  on file  . Number of children: Not on file  . Years of education: 50  . Highest education level: Not on file  Occupational History  . 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IR STYLIST  Tobacco Use  . Smoking status: Never Smoker  . Smokeless tobacco: Never Used  Vaping Use  . Vaping Use: Never used  Substance and Sexual Activity  . Alcohol use: Not Currently     Comment: occ  . Drug use: No  . Sexual activity: Yes    Birth control/protection: Pill  Other Topics Concern  . Not on file  Social History Narrative  . Not on file   Social Determinants of Health   Financial Resource Strain:   . Difficulty of Paying Living Expenses: Not on file  Food Insecurity:   . Worried About Programme researcher, broadcasting/film/video in the Last Year: Not on file  . Ran Out of Food in the Last Year: Not on file  Transportation Needs:   . Lack of Transportation (Medical): Not on file  . Lack of Transportation (Non-Medical): Not on file  Physical Activity:   . Days of Exercise per Week: Not on file  . Minutes of Exercise per Session: Not on file  Stress:   . Feeling of Stress : Not on file  Social Connections:   . Frequency of Communication with Friends and Family: Not on file  . Frequency of Social Gatherings with Friends and Family: Not on file  . Attends Religious Services: Not on file  . Active Member of Clubs or Organizations: Not on file  . Attends Banker Meetings: Not on file  . Marital Status: Not on file  Intimate Partner Violence:   . Fear of Current or Ex-Partner: Not on file  . Emotionally Abused: Not on file  . Physically Abused: Not on file  . Sexually Abused: Not on file    Allergies:  No Known Allergies  Medications: Prior to Admission medications   Medication Sig Start Date End Date Taking? Authorizing Provider  fluticasone (FLONASE) 50 MCG/ACT nasal spray Place 2 sprays into both nostrils daily. 05/12/20  Yes Domenick Gong, MD  ibuprofen (ADVIL) 600 MG tablet Take 1 tablet (600 mg total) by mouth every 6 (six) hours as needed. 05/12/20  Yes Domenick Gong, MD  Norgestimate-Ethinyl Estradiol Triphasic (TRI-ESTARYLLA) 0.18/0.215/0.25 MG-35 MCG tablet Take 1 tablet by mouth daily. 08/06/20  Yes Tresea Mall, CNM    Physical Exam Vitals: Blood pressure 110/68, height 5\' 9"  (1.753 m), weight 154 lb 6.4 oz (70 kg), last menstrual  period 07/22/2020, not currently breastfeeding.  General: NAD HEENT: normocephalic, anicteric Thyroid: no enlargement, no palpable nodules Pulmonary: No increased work of breathing, CTAB Cardiovascular: RRR, distal pulses 2+ Breast: Breast symmetrical, no tenderness, no palpable nodules or masses, no skin or nipple retraction present, no nipple discharge.  No axillary or supraclavicular lymphadenopathy. Abdomen: NABS, soft, non-tender, non-distended.  Umbilicus without lesions.  No hepatomegaly, splenomegaly or masses palpable. No evidence of hernia  Genitourinary:  External: Normal external female genitalia.  Normal urethral meatus, normal Bartholin's and Skene's glands.    Vagina: Normal vaginal mucosa, no evidence of prolapse.    Cervix: not evaluated  Uterus: deferred for no concerns   Adnexa: deferred for no concerns  Rectal: deferred  Lymphatic: no evidence of inguinal lymphadenopathy Extremities: no edema, erythema, or tenderness Neurologic: Grossly intact Psychiatric: mood appropriate, affect full    Assessment: 29 y.o. 38 routine annual exam  Plan: Problem List Items Addressed This Visit  None    Visit Diagnoses    Well woman exam with routine gynecological exam    -  Primary   Encounter for surveillance of contraceptive pills       Relevant Medications   Norgestimate-Ethinyl Estradiol Triphasic (TRI-ESTARYLLA) 0.18/0.215/0.25 MG-35 MCG tablet   Vaginal discharge       Relevant Orders   Cervicovaginal ancillary only      2) STI screening  was offered and declined  2)  ASCCP guidelines and rationale discussed.  Patient opts for every 3 years screening interval. PAP due next year  3) Contraception - the patient is currently using  OCP (estrogen/progesterone).  She is happy with her current form of contraception and plans to continue  4) Routine healthcare maintenance including cholesterol, diabetes screening discussed Declines  5) Return in about 1 year  (around 08/06/2021) for annual established gyn.    Tresea Mall, CNM [facility] 08/06/2020, 3:28 PM

## 2020-08-10 LAB — CERVICOVAGINAL ANCILLARY ONLY
Bacterial Vaginitis (gardnerella): POSITIVE — AB
Candida Glabrata: NEGATIVE
Candida Vaginitis: NEGATIVE
Comment: NEGATIVE
Comment: NEGATIVE
Comment: NEGATIVE

## 2020-08-11 ENCOUNTER — Other Ambulatory Visit: Payer: Self-pay | Admitting: Advanced Practice Midwife

## 2020-08-11 DIAGNOSIS — B9689 Other specified bacterial agents as the cause of diseases classified elsewhere: Secondary | ICD-10-CM

## 2020-08-11 DIAGNOSIS — N760 Acute vaginitis: Secondary | ICD-10-CM

## 2020-08-11 MED ORDER — METRONIDAZOLE 500 MG PO TABS: 500.0000 mg | ORAL_TABLET | Freq: Two times a day (BID) | ORAL | 0 refills | Status: AC

## 2020-08-11 NOTE — Progress Notes (Signed)
Rx metronidazole sent for BV. 

## 2020-10-06 IMAGING — CR DG CHEST 2V
2 series · 2 of 2 positions shown · non-contrast
Comparison: None.

CLINICAL DATA: Shortness of breath.  COVID.

EXAM:
CHEST - 2 VIEW

[chest pa]
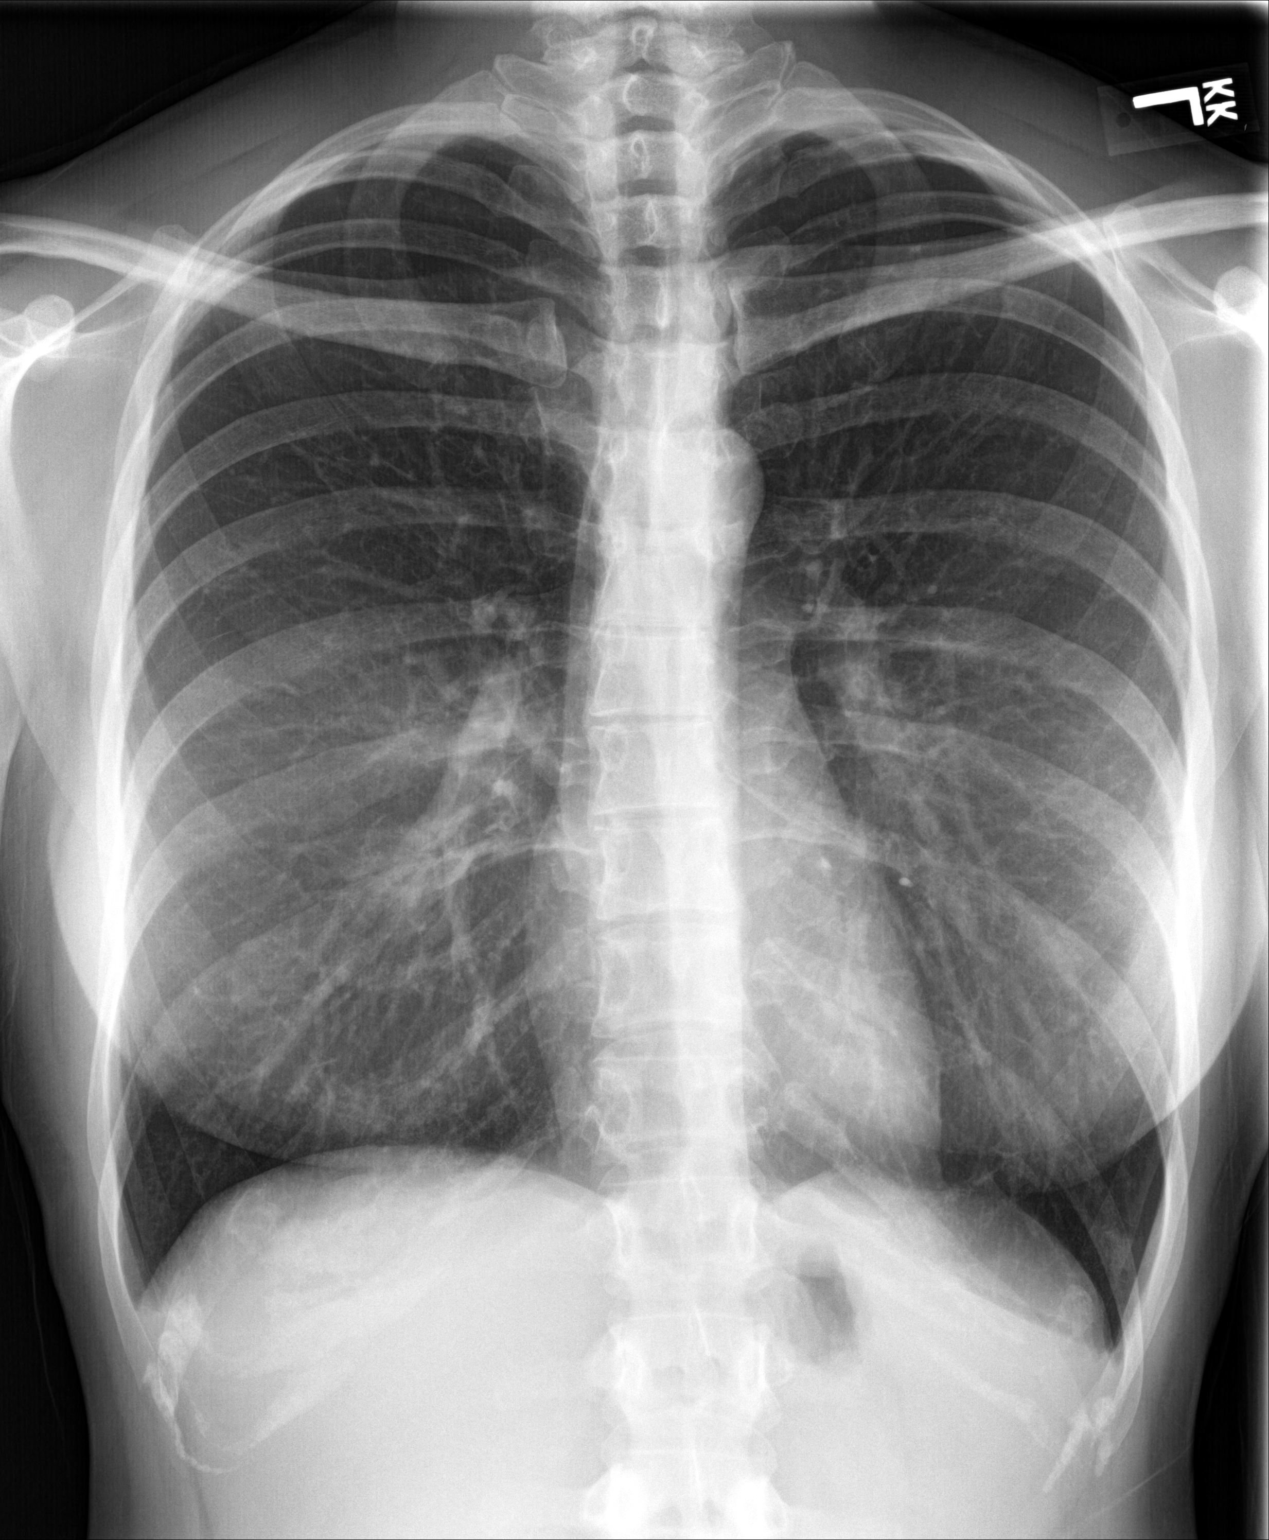

[chest lat]
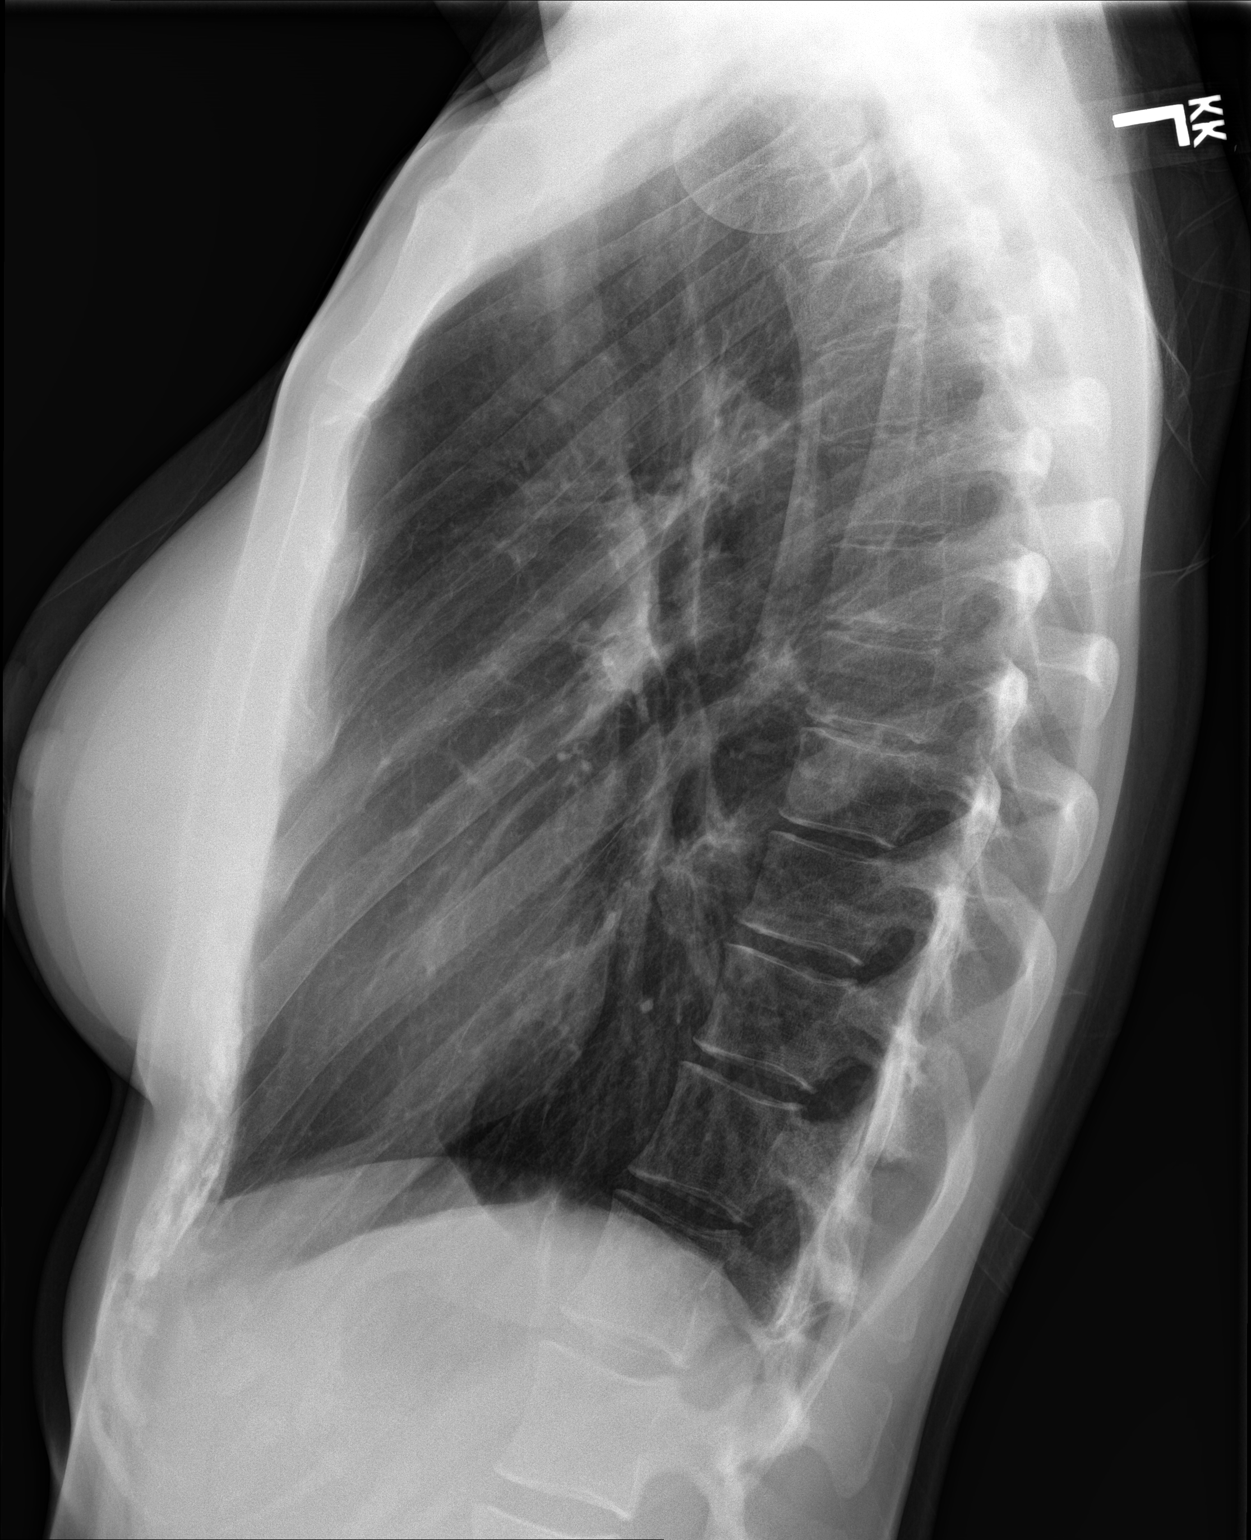

[2 of 2 positions shown; findings below may reference images not displayed]

FINDINGS: Heart size and mediastinal contours are normal. Lungs are clear. No
pleural effusion or pneumothorax is seen. Osseous structures about
the chest are unremarkable.
IMPRESSION: No active cardiopulmonary disease.  No evidence of pneumonia.

## 2020-12-05 ENCOUNTER — Other Ambulatory Visit: Payer: Self-pay

## 2020-12-05 ENCOUNTER — Ambulatory Visit
Admission: RE | Admit: 2020-12-05 | Discharge: 2020-12-05 | Disposition: A | Discharge status: Home / Self Care | Payer: Medicaid Other | Source: Ambulatory Visit | Attending: Family Medicine | Admitting: Family Medicine

## 2020-12-05 VITALS — BP 121/68 | HR 67 | Temp 97.7°F | Resp 18 | Ht 69.0 in | Wt 155.0 lb

## 2020-12-05 DIAGNOSIS — J0100 Acute maxillary sinusitis, unspecified: Secondary | ICD-10-CM | POA: Diagnosis not present

## 2020-12-05 MED ORDER — AMOXICILLIN-POT CLAVULANATE 875-125 MG PO TABS: 1.0000 | ORAL_TABLET | Freq: Two times a day (BID) | ORAL | 0 refills | Status: DC

## 2020-12-05 NOTE — ED Triage Notes (Signed)
Pt c/o sinus pain/pressure/congestion for about 5 days. Pt did have sore throat, this has resolved. Pt does have some right ear pressure. Pt denies f/n/v/d or other symptoms. Pt did take Mucinex 12hr last night with no significant improvement.

## 2020-12-05 NOTE — ED Provider Notes (Signed)
[facility]    CSN: [id number] Arrival date & time: 12/05/20  1056      History   Chief Complaint Chief Complaint  Patient presents with  . Facial Pain   HPI  30 year old female presents with the above complaint.  Patient reports symptoms for the past week.  She reports sinus pain and pressure particularly of the right maxillary region.  She has had some sore throat but this has resolved.  Associated congestion.  She has taken ibuprofen, Mucinex, and DayQuil without resolution.  No fever.  No reported sick contacts.  No known exacerbating factors.  No other complaints.  Past Medical History:  Diagnosis Date  . Abnormal Pap smear of cervix   . History of Papanicolaou smear of cervix 05/10/2015; [id number]   neg; neg  . Immunization, viral disease    gardasil completed    Patient Active Problem List   Diagnosis Date Noted  . Erroneous encounter - disregard 05/12/2019    Past Surgical History:  Procedure Laterality Date  . AUGMENTATION MAMMAPLASTY    . COLPOSCOPY  12/19/2010   lgsil    OB History    Gravida  3   Para  2   Term  2   Preterm      AB  1   Living  2     SAB      IAB  1   Ectopic      Multiple  0   Live Births  2            Home Medications    Prior to Admission medications   Medication Sig Start Date End Date Taking? Authorizing Provider  amoxicillin-clavulanate (AUGMENTIN) 875-125 MG tablet Take 1 tablet by mouth 2 (two) times daily. 12/05/20  Yes Illa Enlow G, DO  fluticasone (FLONASE) 50 MCG/ACT nasal spray Place 2 sprays into both nostrils daily. 05/12/20  Yes Domenick Gong, MD  Norgestimate-Ethinyl Estradiol Triphasic (TRI-ESTARYLLA) 0.18/0.215/0.25 MG-35 MCG tablet Take 1 tablet by mouth daily. 08/06/20  Yes Tresea Mall, CNM  ibuprofen (ADVIL) 600 MG tablet Take 1 tablet (600 mg total) by mouth every 6 (six) hours as needed. 05/12/20   Domenick Gong, MD    Family History Family History  Problem  Relation Age of Onset  . Diabetes Mother        Type 2  . Atrial fibrillation Father   . Diabetes Maternal Grandmother   . Atrial fibrillation Sister   . Aneurysm Brother        brain - half brother  . Cancer Paternal Grandfather        lung    Social History Social History   Tobacco Use  . Smoking status: Never Smoker  . Smokeless tobacco: Never Used  Vaping Use  . Vaping Use: Never used  Substance Use Topics  . Alcohol use: Not Currently    Comment: occ  . Drug use: No     Allergies   Patient has no known allergies.   Review of Systems Review of Systems Per HPI  Physical Exam Triage Vital Signs ED Triage Vitals  Enc Vitals Group     BP 12/05/20 1107 121/68     Pulse Rate 12/05/20 1107 67     Resp 12/05/20 1107 18     Temp 12/05/20 1107 97.7 F (36.5 C)     Temp Source 12/05/20 1107 Oral     SpO2 12/05/20 1107 100 %     Weight 12/05/20 1105 155 lb (  70.3 kg)     Height 12/05/20 1105 5\' 9"  (1.753 m)     Head Circumference --      Peak Flow --      Pain Score 12/05/20 1105 0     Pain Loc --      Pain Edu? --      Excl. in GC? --    Updated Vital Signs BP 121/68 (BP Location: Left Arm)   Pulse 67   Temp 97.7 F (36.5 C) (Oral)   Resp 18   Ht 5\' 9"  (1.753 m)   Wt 70.3 kg   LMP 11/17/2020 (Approximate)   SpO2 100%   BMI 22.89 kg/m   Visual Acuity Right Eye Distance:   Left Eye Distance:   Bilateral Distance:    Right Eye Near:   Left Eye Near:    Bilateral Near:     Physical Exam Vitals and nursing note reviewed.  Constitutional:      General: She is not in acute distress.    Appearance: Normal appearance. She is not ill-appearing.  HENT:     Head: Normocephalic and atraumatic.     Right Ear: Tympanic membrane normal.     Left Ear: Tympanic membrane normal.     Nose:     Comments: Left maxillary sinus tenderness to palpation/percussion. Eyes:     General:        Right eye: No discharge.        Left eye: No discharge.      Conjunctiva/sclera: Conjunctivae normal.  Cardiovascular:     Rate and Rhythm: Normal rate and regular rhythm.     Heart sounds: No murmur heard.   Pulmonary:     Effort: Pulmonary effort is normal.     Breath sounds: Normal breath sounds. No wheezing or rales.  Neurological:     Mental Status: She is alert.  Psychiatric:        Mood and Affect: Mood normal.        Behavior: Behavior normal.    UC Treatments / Results  Labs (all labs ordered are listed, but only abnormal results are displayed) Labs Reviewed - No data to display  EKG   Radiology No results found.  Procedures Procedures (including critical care time)  Medications Ordered in UC Medications - No data to display  Initial Impression / Assessment and Plan / UC Course  I have reviewed the triage vital signs and the nursing notes.  Pertinent labs & imaging results that were available during my care of the patient were reviewed by me and considered in my medical decision making (see chart for details).    30 year old female presents with sinusitis.  Treating with Augmentin.  Final Clinical Impressions(s) / UC Diagnoses   Final diagnoses:  Acute maxillary sinusitis, recurrence not specified   Discharge Instructions   None    ED Prescriptions    Medication Sig Dispense Auth. Provider   amoxicillin-clavulanate (AUGMENTIN) 875-125 MG tablet Take 1 tablet by mouth 2 (two) times daily. 20 tablet 11/19/2020, DO     PDMP not reviewed this encounter.   38, Tommie Sams 12/05/20 1207

## 2021-02-10 ENCOUNTER — Ambulatory Visit
Admission: RE | Admit: 2021-02-10 | Discharge: 2021-02-10 | Disposition: A | Discharge status: Home / Self Care | Payer: Medicaid Other | Source: Ambulatory Visit | Attending: Emergency Medicine | Admitting: Emergency Medicine

## 2021-02-10 ENCOUNTER — Other Ambulatory Visit: Payer: Self-pay

## 2021-02-10 VITALS — BP 139/81 | HR 60 | Temp 98.3°F | Resp 18 | Ht 69.0 in | Wt 155.0 lb

## 2021-02-10 DIAGNOSIS — J0191 Acute recurrent sinusitis, unspecified: Secondary | ICD-10-CM

## 2021-02-10 DIAGNOSIS — R519 Headache, unspecified: Secondary | ICD-10-CM | POA: Diagnosis not present

## 2021-02-10 MED ORDER — CYCLOBENZAPRINE HCL 10 MG PO TABS: 10.0000 mg | ORAL_TABLET | Freq: Three times a day (TID) | ORAL | 0 refills | Status: DC | PRN

## 2021-02-10 MED ORDER — IBUPROFEN 600 MG PO TABS: 600.0000 mg | ORAL_TABLET | Freq: Four times a day (QID) | ORAL | 0 refills | Status: DC | PRN

## 2021-02-10 MED ORDER — AMOXICILLIN-POT CLAVULANATE 875-125 MG PO TABS: 1.0000 | ORAL_TABLET | Freq: Two times a day (BID) | ORAL | 0 refills | Status: AC

## 2021-02-10 NOTE — Discharge Instructions (Signed)
Take the Flexeril as needed.  You may take it at night.  This will help with your TMJ pain preventing him from grinding your teeth.  Take 600 mg of ibuprofen combined with 1000 mg of Tylenol together 3-4 times a day as needed for pain.  Finish the Augmentin, taking it for 7 days total.  Try saline nasal irrigation with a NeilMed sinus rinse with distilled water as often as you want to wash out the infection.  Go immediately to the ED if this happens again or for the signs and symptoms we discussed.  Here is a list of primary care providers who are taking new patients:  Dr. Elizabeth Sauer [address] [place] [address] [phone]  [facility] Primary Care at [facility] [address] [place], [place] [address] [phone]  [facility] [place] [address] [place] [address]  [phone]  [facility] [address] [place], [place] [address] [phone]  [facility] [address] [place]  [phone] [place], [place] [address]  Here are clinics/ other resources who will see you if you do not have insurance. Some have certain criteria that you must meet. Call them and find out what they are:  [facility]: [address], [place], [place] [address] Phone: [phone] Hours: First and Third Saturdays of each Month, 9 a.m. - 1 p.m.  [facility]: [address] [place], [place] [address] Phone: [phone] Hours: Tuesday, 4 p.m. - 8 p.m. Thursday, 1 p.m. - 8 p.m. Wednesday, 9 a.m. - [facility] [address], [place], [place] [address] Phone: [phone] Pharmacy Phone Number: [phone] Dental Phone Number: [phone] [facility] Insurance Help: [phone]  Dental Hours: Monday - Thursday, 8 a.m. - 6 p.m.  [facility] [address], [place], [place] [address] Phone: [phone] Pharmacy Phone Number: [phone] [facility] Insurance Help: [phone]  [facility] [address] [place] [place], [place] [address] Phone: [phone] Pharmacy Phone Number: [phone] [facility] Insurance Help: [phone]  [facility] [address] [place], [place] [address] Phone: [phone] [facility] Insurance Help: [phone]   [facility] [address] [place], [place] [address] Phone: [phone]  Go to www.goodrx.com  or www.costplusdrugs.com to look up your medications. This will give you a list of where you can find your prescriptions at the most affordable prices. Or ask the pharmacist what the cash price is, or if they have any other discount programs available to help make your medication more affordable. This can be less expensive than what you would pay with insurance.

## 2021-02-10 NOTE — ED Provider Notes (Signed)
HPI  SUBJECTIVE:  Diana [name] is a 30 y.o. female who reports the acute onset of a left posterior/occipital headache/neck pain yesterday.  It lasted for about 45 minutes and resolved with 800 mg of ibuprofen.  Patient states that she felt it start to return today, and took more ibuprofen with improvement in her symptoms.  She describes the pain as pressure, throbbing, maximal at onset.  It did not occur during exertion  She states that she has had issues with bilateral sinus pain and pressure with purulent nasal drainage earlier this week, has started herself on some leftover Augmentin 1 tab twice daily 2 days ago and states that this is getting better.  She has also had issues with her left TMJ recently.  She reports some photophobia.  No nausea, vomiting, phonophobia, blurry vision, double vision, visual loss, slurred speech, facial droop, arm or leg weakness, discoordination, fevers, vertigo, numbness or tingling in the hands, jaw pain, ear pain, dental pain, syncope, seizures.  She states that she did not sleep on her neck wrong.  She states that the pain is worse with head movement, better with ibuprofen.  She took ibuprofen within 6 hours of evaluation.  She states that she has never had headaches like this before.  Past medical history of TMJ arthralgia, frequent sinusitis, tension headaches.  No history of diabetes, hypertension, atrial fibrillation, aneurysm, stroke.  Family history significant for half-brother with an aneurysm.  LMP: This week.  Denies possibility being pregnant.  PMD: None.    Past Medical History:  Diagnosis Date  . Abnormal Pap smear of cervix   . History of Papanicolaou smear of cervix 05/10/2015; [id number]   neg; neg  . Immunization, viral disease    gardasil completed    Past Surgical History:  Procedure Laterality Date  . AUGMENTATION MAMMAPLASTY    . COLPOSCOPY  12/19/2010   lgsil    Family History  Problem Relation Age of Onset  . Diabetes Mother         Type 2  . Atrial fibrillation Father   . Diabetes Maternal Grandmother   . Atrial fibrillation Sister   . Aneurysm Brother        brain - half brother  . Cancer Paternal Grandfather        lung    Social History   Tobacco Use  . Smoking status: Never Smoker  . Smokeless tobacco: Never Used  Vaping Use  . Vaping Use: Never used  Substance Use Topics  . Alcohol use: Not Currently    Comment: occ  . Drug use: No    No current facility-administered medications for this encounter.  Current Outpatient Medications:  .  amoxicillin-clavulanate (AUGMENTIN) 875-125 MG tablet, Take 1 tablet by mouth 2 (two) times daily for 5 days., Disp: 10 tablet, Rfl: 0 .  cyclobenzaprine (FLEXERIL) 10 MG tablet, Take 1 tablet (10 mg total) by mouth 3 (three) times daily as needed for muscle spasms., Disp: 20 tablet, Rfl: 0 .  ibuprofen (ADVIL) 600 MG tablet, Take 1 tablet (600 mg total) by mouth every 6 (six) hours as needed., Disp: 30 tablet, Rfl: 0 .  Norgestimate-Ethinyl Estradiol Triphasic (TRI-ESTARYLLA) 0.18/0.215/0.25 MG-35 MCG tablet, Take 1 tablet by mouth daily., Disp: 84 tablet, Rfl: 4 .  fluticasone (FLONASE) 50 MCG/ACT nasal spray, Place 2 sprays into both nostrils daily., Disp: 16 g, Rfl: 0  No Known Allergies   ROS  As noted in HPI.   Physical Exam  BP 139/81 (BP  Location: Left Arm)   Pulse 60   Temp 98.3 F (36.8 C) (Oral)   Resp 18   Ht 5\' 9"  (1.753 m)   Wt 70.3 kg   LMP 02/07/2021   SpO2 100%   Breastfeeding No   BMI 22.89 kg/m   Constitutional: Well developed, well nourished, no acute distress Eyes: PERRL, EOMI, conjunctiva normal bilaterally. Fundoscopic normal b/l. HENT: Normocephalic, atraumatic,mucus membranes moist, normal dentition.  TM normal b/l. No TMJ tenderness.  Positive crepitus right TMJ.  Positive purulent  nasal congestion, no sinus tenderness.   Neck: no cervical LN. positive tenderness at the insertion of the trapezius on the left side where  her headache was.  No trapezial muscle spasm. No meningismus Respiratory: normal inspiratory effort Cardiovascular: Normal rate, regular rhythm.  No carotid bruit GI:  nondistended skin: No rash, skin intact Musculoskeletal: No edema, no tenderness, no deformities Neurologic: Alert & oriented x 3, CN III-XII intact, romberg neg, finger-> nose, heel-> shin equal b/l, Romberg neg, tandem gait steady Psychiatric: Speech and behavior appropriate   ED Course   Medications - No data to display  No orders of the defined types were placed in this encounter.  No results found for this or any previous visit (from the past 24 hour(s)). No results found.   ED Clinical Impression  1. Acute nonintractable headache, unspecified headache type   2. Acute recurrent sinusitis, unspecified location     ED Assessment/Plan  Patient does have some concerning historical features such as severe and sudden onset, however it resolved with some ibuprofen and it returned today in a more gradual fashion.  She has tenderness at the insertion of the trapezius on the left side.  She does not meet the [facility] criteria, however, she has no other neurologic signs or symptoms. SAH, ICH, vertebral artery dissection lower in the differential.   Doubt space occupying lesion. Pt without fevers/chills, Pt has no meningeal sx, no nuchal rigidity. Doubt meningitis. Pt with normal neuro exam, no evidence of CVA/TIA.  Pt BP not elevated significantly, doubt hypertensive emergency. No  evidence of glaucoma or other ocular pathology.  Suspect neck spasm/musculoskeletal headache as this would be the most common explanation for her symptoms.  Will send home with ibuprofen/Tylenol, some Flexeril, which will work for muscle spasms in addition to helping her TMJ.  She also has a partially treated sinus infection.   will send home with an additional 5 days of Augmentin as she has been on it 2 days already, advised her to start saline  nasal irrigation.  Will give patient primary care list and order assistance in finding a PMD.  Very strict ER return precautions given.  Medical decision making, treatment plan and plan for follow-up with patient.  She patient agrees with plan  Meds ordered this encounter  Medications  . amoxicillin-clavulanate (AUGMENTIN) 875-125 MG tablet    Sig: Take 1 tablet by mouth 2 (two) times daily for 5 days.    Dispense:  10 tablet    Refill:  0  . ibuprofen (ADVIL) 600 MG tablet    Sig: Take 1 tablet (600 mg total) by mouth every 6 (six) hours as needed.    Dispense:  30 tablet    Refill:  0  . cyclobenzaprine (FLEXERIL) 10 MG tablet    Sig: Take 1 tablet (10 mg total) by mouth 3 (three) times daily as needed for muscle spasms.    Dispense:  20 tablet    Refill:  0    *  This clinic note was created using Scientist, clinical (histocompatibility and immunogenetics). Therefore, there may be occasional mistakes despite careful proofreading.  ?    Domenick Gong, MD 02/11/21 [phone]

## 2021-02-10 NOTE — ED Triage Notes (Signed)
Pt c/o headache, left jaw pain, and pain in the left back side of her head. Started yesterday. She states she has been having sinus issues the last week and taken some antibiotic she had a home. She has been taking ibuprofen the pain.

## 2021-06-03 DIAGNOSIS — F419 Anxiety disorder, unspecified: Secondary | ICD-10-CM | POA: Insufficient documentation

## 2021-09-09 ENCOUNTER — Telehealth: Payer: Medicaid Other | Admitting: Family

## 2021-09-09 DIAGNOSIS — R6889 Other general symptoms and signs: Secondary | ICD-10-CM

## 2021-09-09 DIAGNOSIS — Z20828 Contact with and (suspected) exposure to other viral communicable diseases: Secondary | ICD-10-CM

## 2021-09-09 MED ORDER — OSELTAMIVIR PHOSPHATE 75 MG PO CAPS
75.0000 mg | ORAL_CAPSULE | Freq: Two times a day (BID) | ORAL | 0 refills | Status: DC
Start: 2021-09-09 — End: 2021-09-21

## 2021-09-09 NOTE — Progress Notes (Signed)
Virtual Visit Consent   Diana [name], you are scheduled for a virtual visit with a [facility] provider today.     Just as with appointments in the office, your consent must be obtained to participate.  Your consent will be active for this visit and any virtual visit you may have with one of our providers in the next 365 days.     If you have a MyChart account, a copy of this consent can be sent to you electronically.  All virtual visits are billed to your insurance company just like a traditional visit in the office.    As this is a virtual visit, video technology does not allow for your provider to perform a traditional examination.  This may limit your provider's ability to fully assess your condition.  If your provider identifies any concerns that need to be evaluated in person or the need to arrange testing (such as labs, EKG, etc.), we will make arrangements to do so.     Although advances in technology are sophisticated, we cannot ensure that it will always work on either your end or our end.  If the connection with a video visit is poor, the visit may have to be switched to a telephone visit.  With either a video or telephone visit, we are not always able to ensure that we have a secure connection.     I need to obtain your verbal consent now.   Are you willing to proceed with your visit today?    Diana [name] has provided verbal consent on 09/09/2021 for a virtual visit (video or telephone).   Diana Rodney, FNP   Date: 09/09/2021 3:16 PM   Virtual Visit via Video Note   I, Diana [name], connected with  Diana [name]  ([id number], 1991-03-25) on 09/09/21 at  3:30 PM EST by a video-enabled telemedicine application and verified that I am speaking with the correct person using two identifiers.  Location: Patient: Virtual Visit Location Patient: Home Provider: Virtual Visit Location Provider: Home Office   I discussed the limitations of evaluation and management by  telemedicine and the availability of in person appointments. The patient expressed understanding and agreed to proceed.    History of Present Illness: Diana [name] is a 30 y.o. who identifies as a female who was assigned female at birth, and is being seen today for flu like symptom. Reports her household has has the flu last week.   HPI: Cough This is a new problem. The current episode started yesterday. The problem has been gradually worsening. The problem occurs every few minutes. The cough is Productive of sputum. Associated symptoms include chills, a fever, myalgias, nasal congestion, postnasal drip and a sore throat. Pertinent negatives include no ear congestion, ear pain, shortness of breath or wheezing. She has tried rest and OTC cough suppressant (tylenol) for the symptoms. The treatment provided mild relief.   Problems:  Patient Active Problem List   Diagnosis Date Noted   Erroneous encounter - disregard 05/12/2019    Allergies: No Known Allergies Medications:  Current Outpatient Medications:    oseltamivir (TAMIFLU) 75 MG capsule, Take 1 capsule (75 mg total) by mouth 2 (two) times daily., Disp: 10 capsule, Rfl: 0   fluticasone (FLONASE) 50 MCG/ACT nasal spray, Place 2 sprays into both nostrils daily., Disp: 16 g, Rfl: 0   Norgestimate-Ethinyl Estradiol Triphasic (TRI-ESTARYLLA) 0.18/0.215/0.25 MG-35 MCG tablet, Take 1 tablet by mouth daily., Disp: 84 tablet, Rfl: 4  Observations/Objective: Patient  is well-developed, well-nourished in no acute distress.  Resting comfortably  at home.  Head is normocephalic, atraumatic.  No labored breathing.  Speech is clear and coherent with logical content.  Patient is alert and oriented at baseline.  Nasal  congestion  Assessment and Plan: 1. Flu-like symptoms - oseltamivir (TAMIFLU) 75 MG capsule; Take 1 capsule (75 mg total) by mouth 2 (two) times daily.  Dispense: 10 capsule; Refill: 0  2. Exposure to influenza - oseltamivir  (TAMIFLU) 75 MG capsule; Take 1 capsule (75 mg total) by mouth 2 (two) times daily.  Dispense: 10 capsule; Refill: 0 Rest, force fluids, tylenol as needed,  report any worsening symptoms such as increased shortness of breath, swelling, or continued high fevers. Possible adverse effects discussed with antivirals.    Follow Up Instructions: I discussed the assessment and treatment plan with the patient. The patient was provided an opportunity to ask questions and all were answered. The patient agreed with the plan and demonstrated an understanding of the instructions.  A copy of instructions were sent to the patient via MyChart unless otherwise noted below.     The patient was advised to call back or seek an in-person evaluation if the symptoms worsen or if the condition fails to improve as anticipated.  Time:  I spent 7 minutes with the patient via telehealth technology discussing the above problems/concerns.    Diana Rodney, FNP

## 2021-09-21 ENCOUNTER — Ambulatory Visit (INDEPENDENT_AMBULATORY_CARE_PROVIDER_SITE_OTHER): Payer: Medicaid Other | Admitting: Advanced Practice Midwife

## 2021-09-21 ENCOUNTER — Other Ambulatory Visit (HOSPITAL_COMMUNITY)
Admission: RE | Admit: 2021-09-21 | Discharge: 2021-09-21 | Disposition: A | Discharge status: Home / Self Care | Payer: Medicaid Other | Source: Ambulatory Visit | Attending: Advanced Practice Midwife | Admitting: Advanced Practice Midwife

## 2021-09-21 ENCOUNTER — Other Ambulatory Visit: Payer: Self-pay

## 2021-09-21 ENCOUNTER — Encounter: Payer: Self-pay | Admitting: Advanced Practice Midwife

## 2021-09-21 VITALS — BP 120/80 | Ht 69.0 in | Wt 159.0 lb

## 2021-09-21 DIAGNOSIS — Z01419 Encounter for gynecological examination (general) (routine) without abnormal findings: Secondary | ICD-10-CM | POA: Diagnosis present

## 2021-09-21 DIAGNOSIS — Z3041 Encounter for surveillance of contraceptive pills: Secondary | ICD-10-CM | POA: Diagnosis not present

## 2021-09-21 DIAGNOSIS — Z124 Encounter for screening for malignant neoplasm of cervix: Secondary | ICD-10-CM | POA: Diagnosis present

## 2021-09-21 DIAGNOSIS — N898 Other specified noninflammatory disorders of vagina: Secondary | ICD-10-CM

## 2021-09-21 MED ORDER — NORGESTIM-ETH ESTRAD TRIPHASIC 0.18/0.215/0.25 MG-35 MCG PO TABS: 1.0000 | ORAL_TABLET | Freq: Every day | ORAL | 4 refills | Status: DC

## 2021-09-21 NOTE — Progress Notes (Signed)
Gynecology Annual Exam   PCP: Patient, No Pcp Per (Inactive)  Chief Complaint:  Chief Complaint  Patient presents with   Annual Exam    History of Present Illness: Patient is a 30 y.o. [id number] presents for annual exam. The patient has no complaints today. She requests vaginitis screening since she has a history of asymptomatic BV. She had the flu recently and feels better now. ROS reflects recent symptoms.   LMP: Patient's last menstrual period was 09/21/2021. Average Interval: regular, 28 days Duration of flow: 5 days Heavy Menses: no Clots: no Intermenstrual Bleeding: no Postcoital Bleeding: no Dysmenorrhea: no  The patient is sexually active. She currently uses OCP (estrogen/progesterone) for contraception. She denies dyspareunia.  The patient does perform self breast exams.  There is no notable family history of breast or ovarian cancer in her family.  The patient wears seatbelts: yes.   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  She admits healthy lifestyle; diet, hydration and sleep  The patient denies current symptoms of depression.  She is coping well with current dose of Lexapro.  Review of Systems: Review of Systems  Constitutional:  Negative for chills and fever.  HENT:  Positive for congestion and sore throat. Negative for ear discharge, ear pain, hearing loss and sinus pain.   Eyes:  Negative for blurred vision and double vision.  Respiratory:  Positive for cough. Negative for shortness of breath and wheezing.   Cardiovascular:  Negative for chest pain, palpitations and leg swelling.  Gastrointestinal:  Positive for nausea and vomiting. Negative for abdominal pain, blood in stool, constipation, diarrhea, heartburn and melena.  Genitourinary:  Negative for dysuria, flank pain, frequency, hematuria and urgency.  Musculoskeletal:  Negative for back pain, joint pain and myalgias.  Skin:  Negative for itching  and rash.  Neurological:  Negative for dizziness, tingling, tremors, sensory change, speech change, focal weakness, seizures, loss of consciousness, weakness and headaches.  Endo/Heme/Allergies:  Negative for environmental allergies. Does not bruise/bleed easily.  Psychiatric/Behavioral:  Negative for depression, hallucinations, memory loss, substance abuse and suicidal ideas. The patient is not nervous/anxious and does not have insomnia.    Past Medical History:  Patient Active Problem List   Diagnosis Date Noted   Anxiety 06/03/2021    Past Surgical History:  Past Surgical History:  Procedure Laterality Date   AUGMENTATION MAMMAPLASTY     COLPOSCOPY  12/19/2010   lgsil    Gynecologic History:  Patient's last menstrual period was 09/21/2021. Contraception: OCP (estrogen/progesterone) Last Pap: 3 years ago Results were: no abnormalities   Obstetric History: [id number]  Family History:  Family History  Problem Relation Age of Onset   Diabetes Mother        Type 2   Atrial fibrillation Father    Diabetes Maternal Grandmother    Atrial fibrillation Sister    Aneurysm Brother        brain - half brother   Cancer Paternal Grandfather        lung    Social History:  Social History   Socioeconomic History   Marital status: Single    Spouse name: Not on file   Number of children: Not on file   Years of education: 14   Highest education level: Not on file  Occupational History   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[employer] ST[employer]  Tobacco Use   Smoking status: Never   Smokeless tobacco: Never  Vaping Use  Vaping Use: Never used  Substance and Sexual Activity   Alcohol use: Not Currently    Comment: occ   Drug use: No   Sexual activity: Yes    Birth control/protection: Pill  Other Topics Concern   Not on file  Social History Narrative   Not on file   Social Determinants of Health   Financial Resource Strain: Not on file  Food Insecurity: Not on file  Transportation Needs: Not on file   Physical Activity: Not on file  Stress: Not on file  Social Connections: Not on file  Intimate Partner Violence: Not on file    Allergies:  No Known Allergies  Medications: Prior to Admission medications   Medication Sig Start Date End Date Taking? Authorizing Provider  escitalopram (LEXAPRO) 10 MG tablet Take 10 mg by mouth daily. 09/01/21  Yes [provider]  fluticasone (FLONASE) 50 MCG/ACT nasal spray Place 2 sprays into both nostrils daily. 05/12/20  Yes Melynda Ripple, MD  Norgestimate-Ethinyl Estradiol Triphasic (TRI-ESTARYLLA) 0.18/0.215/0.25 MG-35 MCG tablet Take 1 tablet by mouth daily. 09/21/21   Rod Can, CNM    Physical Exam Vitals: Blood pressure 120/80, height 5\' 9"  (1.753 m), weight 159 lb (72.1 kg), last menstrual period 09/21/2021.  General: NAD HEENT: normocephalic, anicteric Thyroid: no enlargement, no palpable nodules Pulmonary: No increased work of breathing, CTAB Cardiovascular: RRR, distal pulses 2+ Breast: Breast symmetrical, no tenderness, no palpable nodules or masses, no skin or nipple retraction present, no nipple discharge.  No axillary or supraclavicular lymphadenopathy. Abdomen: NABS, soft, non-tender, non-distended.  Umbilicus without lesions.  No hepatomegaly, splenomegaly or masses palpable. No evidence of hernia  Genitourinary:  External: Normal external female genitalia.  Normal urethral meatus, normal Bartholin's and Skene's glands.    Vagina: Normal vaginal mucosa, no evidence of prolapse.    Cervix: Grossly normal in appearance, menstrual bleeding, no CMT  Uterus: Non-enlarged, mobile, normal contour.    Adnexa: ovaries non-enlarged, no adnexal masses  Rectal: deferred Extremities: no edema, erythema, or tenderness Neurologic: Grossly intact Psychiatric: mood appropriate, affect full   Assessment: 30 y.o. [id number] routine annual exam  Plan: Problem List Items Addressed This Visit   None Visit Diagnoses     Well  woman exam with routine gynecological exam    -  Primary   Relevant Orders   Cytology - PAP   Cervicovaginal ancillary only   Screening for cervical cancer       Relevant Orders   Cytology - PAP   Encounter for surveillance of contraceptive pills       Relevant Medications   Norgestimate-Ethinyl Estradiol Triphasic (TRI-ESTARYLLA) 0.18/0.215/0.25 MG-35 MCG tablet   Vaginal odor       Relevant Orders   Cervicovaginal ancillary only       1) STI screening  was offered and declined  2)  ASCCP guidelines and rationale discussed.  Patient opts for every 3 years screening interval  3) Contraception - the patient is currently using  OCP (estrogen/progesterone).  She is happy with her current form of contraception and plans to continue  4) Routine healthcare maintenance including cholesterol, diabetes screening discussed Declines  5) Return in about 1 year (around 09/21/2022) for annual established gyn.   Rod Can, [facility] 09/21/2021, 4:59 PM

## 2021-09-23 ENCOUNTER — Other Ambulatory Visit: Payer: Self-pay | Admitting: Advanced Practice Midwife

## 2021-09-23 DIAGNOSIS — B3731 Acute candidiasis of vulva and vagina: Secondary | ICD-10-CM

## 2021-09-23 DIAGNOSIS — B9689 Other specified bacterial agents as the cause of diseases classified elsewhere: Secondary | ICD-10-CM

## 2021-09-23 LAB — CYTOLOGY - PAP
Comment: NEGATIVE
Diagnosis: NEGATIVE
High risk HPV: NEGATIVE

## 2021-09-23 LAB — CERVICOVAGINAL ANCILLARY ONLY
Bacterial Vaginitis (gardnerella): POSITIVE — AB
Candida Glabrata: NEGATIVE
Candida Vaginitis: NEGATIVE
Comment: NEGATIVE
Comment: NEGATIVE
Comment: NEGATIVE

## 2021-09-23 MED ORDER — METRONIDAZOLE 500 MG PO TABS: 500.0000 mg | ORAL_TABLET | Freq: Two times a day (BID) | ORAL | 0 refills | Status: AC

## 2021-09-23 MED ORDER — FLUCONAZOLE 150 MG PO TABS: 150.0000 mg | ORAL_TABLET | Freq: Once | ORAL | 1 refills | Status: AC

## 2021-09-23 NOTE — Progress Notes (Signed)
Rx metronidazole to treat BV and diflucan sent in case yeast develops. Message to patient.

## 2021-10-03 ENCOUNTER — Encounter: Payer: Self-pay | Admitting: Advanced Practice Midwife

## 2021-10-25 ENCOUNTER — Other Ambulatory Visit: Payer: Self-pay

## 2021-10-25 ENCOUNTER — Ambulatory Visit
Admission: EM | Admit: 2021-10-25 | Discharge: 2021-10-25 | Disposition: A | Discharge status: Home / Self Care | Payer: Medicaid Other | Attending: Internal Medicine | Admitting: Internal Medicine

## 2021-10-25 ENCOUNTER — Encounter: Payer: Self-pay | Admitting: Emergency Medicine

## 2021-10-25 DIAGNOSIS — K529 Noninfective gastroenteritis and colitis, unspecified: Secondary | ICD-10-CM | POA: Diagnosis present

## 2021-10-25 DIAGNOSIS — M545 Low back pain, unspecified: Secondary | ICD-10-CM | POA: Insufficient documentation

## 2021-10-25 LAB — URINALYSIS, ROUTINE W REFLEX MICROSCOPIC
Glucose, UA: NEGATIVE mg/dL
Ketones, ur: NEGATIVE mg/dL
Leukocytes,Ua: NEGATIVE
Nitrite: NEGATIVE
Protein, ur: 30 mg/dL — AB
Specific Gravity, Urine: 1.025 (ref 1.005–1.030)
pH: 6 (ref 5.0–8.0)

## 2021-10-25 LAB — URINALYSIS, MICROSCOPIC (REFLEX)

## 2021-10-25 NOTE — Discharge Instructions (Signed)
Symptoms, exam and UA today seem most consistent with longer standing musculoskeletal low back pain with a more recent stomach bug aggravating the discomfort.  A urine culture is pending, to be sure we are not overlooking a urinary tract infection.  Push fluids and rest.  Advance diet to normal as tolerated.   Gentle stretching and core strengthening may help relieve back pain; a physical therapist can give additional guidance.  An anti inflammatory such as ibuprofen 2-4 tabs up to 4 times daily is often also helpful (would take the lowest helpful dose).  Ice for 10-15 minutes several times daily may provide additional relief.

## 2021-10-25 NOTE — ED Triage Notes (Addendum)
Pt c/o lower back pain, and abdominal pain. Started about a week ago She states yesterday she had nausea/ vomiting and diarrhea. She wants to makes sure she does not having anything going on with her kidneys. Denies dysuria. She states the pain is worse in certain positions or while laying in bed.

## 2021-10-25 NOTE — ED Provider Notes (Signed)
[facility]    CSN: [id number] Arrival date & time: 10/25/21  0957      History   Chief Complaint Chief Complaint  Patient presents with   Back Pain    HPI Diana [name] is a 31 y.o. female.  She presents today with what sounds like 2 concerns, 1 is about a 3-week history of bilateral low back pain, nonradiating.  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  Episodically she has to lift a 5 gallon jug, although she is trying to do this carefully so as not to upset her back.  She might lift a 5 gallon jug every few days, or sometimes as often as several times in a day, she lift this from the floor and empties it into the sterilizer unit.  She also has a 30 pound child that she picks up several times a day, and a 50 pound child that she limits carrying.  No new activities otherwise or exercise programs. No weakness or clumsiness in her legs, no loss of sensation, no change in bowel or bladder control.  She does have a history of constipation that comes and goes. The second is that she had vomiting and diarrhea yesterday, which has subsided, but not quite back to normal in terms of eating and appetite.  Has had some mid abdominal discomfort that is improving, but not resolved.  Also a little discomfort in the left lower quadrant.  Had 3 soft/watery stools yesterday, and threw up 4 times.  Stool today is not quite normal, but improving, and there has been no additional vomiting.  No fever.  No dysuria, no urinary frequency.  No unusual vaginal discharge or bleeding.  Did have some chills yesterday evening, now resolved.  HPI  Past Medical History:  Diagnosis Date   Abnormal Pap smear of cervix    History of Papanicolaou smear of cervix 05/10/2015; [id number]   neg; neg   Immunization, viral disease    gardasil completed    Patient Active Problem List   Diagnosis Date Noted   Anxiety 06/03/2021    Past Surgical  History:  Procedure Laterality Date   AUGMENTATION MAMMAPLASTY     COLPOSCOPY  12/19/2010   lgsil    OB History     Gravida  3   Para  2   Term  2   Preterm      AB  1   Living  2      SAB      IAB  1   Ectopic      Multiple  0   Live Births  2            Home Medications    Prior to Admission medications   Medication Sig Start Date End Date Taking? Authorizing Provider  escitalopram (LEXAPRO) 10 MG tablet Take 10 mg by mouth daily. 09/01/21  Yes [provider]  Norgestimate-Ethinyl Estradiol Triphasic (TRI-ESTARYLLA) 0.18/0.215/0.25 MG-35 MCG tablet Take 1 tablet by mouth daily. 09/21/21  Yes Tresea Mall, CNM    Family History Family History  Problem Relation Age of Onset   Diabetes Mother        Type 2   Atrial fibrillation Father    Diabetes Maternal Grandmother    Atrial fibrillation Sister    Aneurysm Brother        brain - half brother   Cancer Paternal Grandfather  lung    Social History Social History   Tobacco Use   Smoking status: Never   Smokeless tobacco: Never  Vaping Use   Vaping Use: Never used  Substance Use Topics   Alcohol use: Not Currently    Comment: occ   Drug use: No     Allergies   Patient has no known allergies.   Review of Systems Review of Systems see HPI   Physical Exam Triage Vital Signs ED Triage Vitals  Enc Vitals Group     BP 10/25/21 1014 122/86     Pulse Rate 10/25/21 1014 86     Resp 10/25/21 1014 18     Temp 10/25/21 1014 98.7 F (37.1 C)     Temp Source 10/25/21 1014 Oral     SpO2 10/25/21 1014 98 %     Weight 10/25/21 1012 158 lb 15.2 oz (72.1 kg)     Height 10/25/21 1012 5\' 9"  (1.753 m)     Pain Score 10/25/21 1011 6     Pain Loc --     Updated Vital Signs BP 122/86 (BP Location: Right Arm)    Pulse 86    Temp 98.7 F (37.1 C) (Oral)    Resp 18    Ht 5\' 9"  (1.753 m)    Wt 72.1 kg    LMP 10/13/2021    SpO2 98%    BMI 23.47 kg/m   Physical  Exam Constitutional:      General: She is not in acute distress.    Appearance: She is not ill-appearing or toxic-appearing.     Comments: Good hygiene  HENT:     Head: Atraumatic.     Mouth/Throat:     Mouth: Mucous membranes are moist.  Eyes:     Conjunctiva/sclera:     Right eye: Right conjunctiva is not injected. No exudate.    Left eye: Left conjunctiva is not injected. No exudate.    Comments: Conjugate gaze observed  Cardiovascular:     Rate and Rhythm: Normal rate and regular rhythm.  Pulmonary:     Effort: Pulmonary effort is normal. No respiratory distress.     Breath sounds: No wheezing or rhonchi.  Abdominal:     General: There is no distension.     Palpations: Abdomen is soft.     Tenderness: There is no guarding or rebound.     Comments: Mildly tender to deep palpation in the left lower quadrant, nonfocal  Musculoskeletal:     Cervical back: Neck supple.     Comments: Walked into the urgent care independently, gait is unremarkable.  Able to stand on heels and toes, and climb on/off the exam table quickly and easily.  Skin:    General: Skin is warm and dry.     Comments: Pink, no cyanosis  Neurological:     Mental Status: She is alert.     Comments: Face is symmetric, speech is clear and coherent     UC Treatments / Results  Labs (all labs ordered are listed, but only abnormal results are displayed) Labs Reviewed  URINALYSIS, ROUTINE W REFLEX MICROSCOPIC - Abnormal; Notable for the following components:      Result Value   Hgb urine dipstick TRACE (*)    Bilirubin Urine SMALL (*)    Protein, ur 30 (*)    All other components within normal limits  URINALYSIS, MICROSCOPIC (REFLEX) - Abnormal; Notable for the following components:   Bacteria, UA FEW (*)  All other components within normal limits  URINE CULTURE  UA negative for RBC, WBC, Nitrite, Leuks; positive for few bacteria, 6-10 squames, tr hgb, small bili, protein.  Cx  pending.  EKG NA  Radiology No results found. NA  Procedures Procedures (including critical care time) NA  Medications Ordered in UC Medications - No data to display NA   Final Clinical Impressions(s) / UC Diagnoses   Final diagnoses:  Acute bilateral low back pain without sciatica  Acute gastroenteritis     Discharge Instructions      Symptoms, exam and UA today seem most consistent with longer standing musculoskeletal low back pain with a more recent stomach bug aggravating the discomfort.  A urine culture is pending, to be sure we are not overlooking a urinary tract infection.  Push fluids and rest.  Advance diet to normal as tolerated.   Gentle stretching and core strengthening may help relieve back pain; a physical therapist can give additional guidance.  An anti inflammatory such as ibuprofen 2-4 tabs up to 4 times daily is often also helpful (would take the lowest helpful dose).  Ice for 10-15 minutes several times daily may provide additional relief.      ED Prescriptions   None    PDMP not reviewed this encounter.   Isa RankinMurray, Analysia Dungee Wilson, MD 10/25/21 [phone]

## 2021-10-26 ENCOUNTER — Ambulatory Visit: Payer: Self-pay

## 2021-10-27 LAB — URINE CULTURE

## 2022-03-13 ENCOUNTER — Other Ambulatory Visit: Payer: Self-pay

## 2022-03-13 ENCOUNTER — Encounter: Payer: Self-pay | Admitting: Advanced Practice Midwife

## 2022-03-13 DIAGNOSIS — Z3041 Encounter for surveillance of contraceptive pills: Secondary | ICD-10-CM

## 2022-03-13 MED ORDER — NORGESTIM-ETH ESTRAD TRIPHASIC 0.18/0.215/0.25 MG-35 MCG PO TABS: 1.0000 | ORAL_TABLET | Freq: Every day | ORAL | 0 refills | Status: DC

## 2022-03-24 ENCOUNTER — Ambulatory Visit: Payer: Self-pay

## 2022-09-22 ENCOUNTER — Ambulatory Visit (INDEPENDENT_AMBULATORY_CARE_PROVIDER_SITE_OTHER): Payer: Medicaid Other | Admitting: Obstetrics and Gynecology

## 2022-09-22 VITALS — BP 108/60 | HR 60 | Resp 16 | Ht 69.0 in | Wt 188.2 lb

## 2022-09-22 DIAGNOSIS — Z01419 Encounter for gynecological examination (general) (routine) without abnormal findings: Secondary | ICD-10-CM

## 2022-09-22 DIAGNOSIS — Z3041 Encounter for surveillance of contraceptive pills: Secondary | ICD-10-CM

## 2022-09-22 MED ORDER — NORGESTIM-ETH ESTRAD TRIPHASIC 0.18/0.215/0.25 MG-35 MCG PO TABS: 1.0000 | ORAL_TABLET | Freq: Every day | ORAL | 3 refills | Status: DC

## 2022-09-22 NOTE — Patient Instructions (Signed)
Preventive Care 21-33 Years Old, Female Preventive care refers to lifestyle choices and visits with your health care provider that can promote health and wellness. Preventive care visits are also called wellness exams. What can I expect for my preventive care visit? Counseling During your preventive care visit, your health care provider may ask about your: Medical history, including: Past medical problems. Family medical history. Pregnancy history. Current health, including: Menstrual cycle. Method of birth control. Emotional well-being. Home life and relationship well-being. Sexual activity and sexual health. Lifestyle, including: Alcohol, nicotine or tobacco, and drug use. Access to firearms. Diet, exercise, and sleep habits. Work and work environment. Sunscreen use. Safety issues such as seatbelt and bike helmet use. Physical exam Your health care provider may check your: Height and weight. These may be used to calculate your BMI (body mass index). BMI is a measurement that tells if you are at a healthy weight. Waist circumference. This measures the distance around your waistline. This measurement also tells if you are at a healthy weight and may help predict your risk of certain diseases, such as type 2 diabetes and high blood pressure. Heart rate and blood pressure. Body temperature. Skin for abnormal spots. What immunizations do I need?  Vaccines are usually given at various ages, according to a schedule. Your health care provider will recommend vaccines for you based on your age, medical history, and lifestyle or other factors, such as travel or where you work. What tests do I need? Screening Your health care provider may recommend screening tests for certain conditions. This may include: Pelvic exam and Pap test. Lipid and cholesterol levels. Diabetes screening. This is done by checking your blood sugar (glucose) after you have not eaten for a while (fasting). Hepatitis  B test. Hepatitis C test. HIV (human immunodeficiency virus) test. STI (sexually transmitted infection) testing, if you are at risk. BRCA-related cancer screening. This may be done if you have a family history of breast, ovarian, tubal, or peritoneal cancers. Talk with your health care provider about your test results, treatment options, and if necessary, the need for more tests. Follow these instructions at home: Eating and drinking  Eat a healthy diet that includes fresh fruits and vegetables, whole grains, lean protein, and low-fat dairy products. Take vitamin and mineral supplements as recommended by your health care provider. Do not drink alcohol if: Your health care provider tells you not to drink. You are pregnant, may be pregnant, or are planning to become pregnant. If you drink alcohol: Limit how much you have to 0-1 drink a day. Know how much alcohol is in your drink. In the [place] one drink equals one 12 oz bottle of beer (355 mL), one 5 oz glass of wine (148 mL), or one 1 oz glass of hard liquor (44 mL). Lifestyle Brush your teeth every morning and night with fluoride toothpaste. Floss one time each day. Exercise for at least 30 minutes 5 or more days each week. Do not use any products that contain nicotine or tobacco. These products include cigarettes, chewing tobacco, and vaping devices, such as e-cigarettes. If you need help quitting, ask your health care provider. Do not use drugs. If you are sexually active, practice safe sex. Use a condom or other form of protection to prevent STIs. If you do not wish to become pregnant, use a form of birth control. If you plan to become pregnant, see your health care provider for a prepregnancy visit. Find healthy ways to manage stress, such as: Meditation,   yoga, or listening to music. Journaling. Talking to a trusted person. Spending time with friends and family. Minimize exposure to UV radiation to reduce your risk of skin  cancer. Safety Always wear your seat belt while driving or riding in a vehicle. Do not drive: If you have been drinking alcohol. Do not ride with someone who has been drinking. If you have been using any mind-altering substances or drugs. While texting. When you are tired or distracted. Wear a helmet and other protective equipment during sports activities. If you have firearms in your house, make sure you follow all gun safety procedures. Seek help if you have been physically or sexually abused. What's next? Go to your health care provider once a year for an annual wellness visit. Ask your health care provider how often you should have your eyes and teeth checked. Stay up to date on all vaccines. This information is not intended to replace advice given to you by your health care provider. Make sure you discuss any questions you have with your health care provider. Document Revised: 03/09/2021 Document Reviewed: 03/09/2021 Elsevier Patient Education  [place] Breast self-awareness is knowing how your breasts look and feel. You need to: Check your breasts on a regular basis. Tell your doctor about any changes. Become familiar with the look and feel of your breasts. This can help you catch a breast problem while it is still small and can be treated. You should do breast self-exams even if you have breast implants. What you need: A mirror. A well-lit room. A pillow or other soft object. How to do a breast self-exam Follow these steps to do a breast self-exam: Look for changes  Take off all the clothes above your waist. Stand in front of a mirror in a room with good lighting. Put your hands down at your sides. Compare your breasts in the mirror. Look for any difference between them, such as: A difference in shape. A difference in size. Wrinkles, dips, and bumps in one breast and not the other. Look at each breast for changes in the skin, such  as: Redness. Scaly areas. Skin that has gotten thicker. Dimpling. Open sores (ulcers). Look for changes in your nipples, such as: Fluid coming out of a nipple. Fluid around a nipple. Bleeding. Dimpling. Redness. A nipple that looks pushed in (retracted), or that has changed position. Feel for changes Lie on your back. Feel each breast. To do this: Pick a breast to feel. Place a pillow under the shoulder closest to that breast. Put the arm closest to that breast behind your head. Feel the nipple area of that breast using the hand of your other arm. Feel the area with the pads of your three middle fingers by making small circles with your fingers. Use light, medium, and firm pressure. Continue the overlapping circles, moving downward over the breast. Keep making circles with your fingers. Stop when you feel your ribs. Start making circles with your fingers again, this time going upward until you reach your collarbone. Then, make circles outward across your breast and into your armpit area. Squeeze your nipple. Check for discharge and lumps. Repeat these steps to check your other breast. Sit or stand in the tub or shower. With soapy water on your skin, feel each breast the same way you did when you were lying down. Write down what you find Writing down what you find can help you remember what to tell your doctor. Write down: What is  normal for each breast. Any changes you find in each breast. These include: The kind of changes you find. A tender or painful breast. Any lump you find. Write down its size and where it is. When you last had your monthly period (menstrual cycle). General tips If you are breastfeeding, the best time to check your breasts is after you feed your baby or after you use a breast pump. If you get monthly bleeding, the best time to check your breasts is 5-7 days after your monthly cycle ends. With time, you will become comfortable with the self-exam. You will  also start to know if there are changes in your breasts. Contact a doctor if: You see a change in the shape or size of your breasts or nipples. You see a change in the skin of your breast or nipples, such as red or scaly skin. You have fluid coming from your nipples that is not normal. You find a new lump or thick area. You have breast pain. You have any concerns about your breast health. Summary Breast self-awareness includes looking for changes in your breasts and feeling for changes within your breasts. You should do breast self-awareness in front of a mirror in a well-lit room. If you get monthly periods (menstrual cycles), the best time to check your breasts is 5-7 days after your period ends. Tell your doctor about any changes you see in your breasts. Changes include changes in size, changes on the skin, painful or tender breasts, or fluid from your nipples that is not normal. This information is not intended to replace advice given to you by your health care provider. Make sure you discuss any questions you have with your health care provider. Document Revised: 02/16/2022 Document Reviewed: 07/14/2021 Elsevier Patient Education  [place].

## 2022-09-22 NOTE — Progress Notes (Signed)
GYNECOLOGY ANNUAL PHYSICAL EXAM PROGRESS NOTE  Subjective:    Diana [name] is a 31 y.o. [phone] female who presents for an annual exam. The patient has no complaints today. The patient is sexually active. The patient participates in regular exercise: no. Has the patient ever been transfused or tattooed?: yes- professional tattoos. The patient reports that there is not domestic violence in her life.    Menstrual History: Menarche age: 35 Patient's last menstrual period was 09/20/2022 (exact date). Period Cycle (Days): 28 Period Duration (Days): 5 Period Pattern: Regular Menstrual Flow: Light, Moderate Menstrual Control: Tampon Menstrual Control Change Freq (Hours): 2-3 Dysmenorrhea: None   Gynecologic History:  Contraception: OCP (estrogen/progesterone) History of STI's: Denies Last Pap: 09/21/2021. Results were: normal.  Reports remote h/o abnormal pap smears.   OB History  Gravida Para Term Preterm AB Living  3 2 2  0 1 2  SAB IAB Ectopic Multiple Live Births  0 1 0 0 2    # Outcome Date GA Lbr Len/2nd Weight Sex Delivery Anes PTL Lv  3 Term 05/17/19 [redacted]w[redacted]d  8 lb 3.9 oz (3.74 kg) F Vag-Spont EPI  LIV     Birth Comments: no anomalies noted at delivery     Name: Stopher,GIRL Brixton     Apgar1: 8  Apgar5: 9  2 Term 05/24/10   7 lb 11 oz (3.487 kg) F Vag-Spont   LIV     Name: AUBREY  1 IAB             Past Medical History:  Diagnosis Date   Abnormal Pap smear of cervix    History of Papanicolaou smear of cervix 05/10/2015; 05/12/2015   neg; neg   Immunization, viral disease    gardasil completed    Past Surgical History:  Procedure Laterality Date   AUGMENTATION MAMMAPLASTY     COLPOSCOPY  12/19/2010   lgsil    Family History  Problem Relation Age of Onset   Diabetes Mother        Type 2   Atrial fibrillation Father    Diabetes Maternal Grandmother    Atrial fibrillation Sister    Aneurysm Brother        brain - half brother   Cancer Paternal  Grandfather        lung    Social History   Socioeconomic History   Marital status: Single    Spouse name: Not on file   Number of children: Not on file   Years of education: 14   Highest education level: Not on file  Occupational History   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[employer] ST[employer]  Tobacco Use   Smoking status: Never   Smokeless tobacco: Never  Vaping Use   Vaping Use: Never used  Substance and Sexual Activity   Alcohol use: Not Currently    Comment: occ   Drug use: No   Sexual activity: Yes    Birth control/protection: Pill  Other Topics Concern   Not on file  Social History Narrative   Not on file   Social Determinants of Health   Financial Resource Strain: Not on file  Food Insecurity: Not on file  Transportation Needs: Not on file  Physical Activity: Not on file  Stress: Not on file  Social Connections: Not on file  Intimate Partner Violence: Not on file    Current Outpatient Medications on File Prior to Visit  Medication Sig Dispense Refill   escitalopram (LEXAPRO) 10 MG tablet Take 10 mg by mouth daily.  Norgestimate-Ethinyl Estradiol Triphasic (TRI-ESTARYLLA) 0.18/0.215/0.25 MG-35 MCG tablet Take 1 tablet by mouth daily. 30 tablet 0   No current facility-administered medications on file prior to visit.    No Known Allergies   Review of Systems Constitutional: negative for chills, fatigue, fevers and sweats Eyes: negative for irritation, redness and visual disturbance Ears, nose, mouth, throat, and face: negative for hearing loss, nasal congestion, snoring and tinnitus Respiratory: negative for asthma, cough, sputum Cardiovascular: negative for chest pain, dyspnea, exertional chest pressure/discomfort, irregular heart beat, palpitations and syncope Gastrointestinal: negative for abdominal pain, change in bowel habits, nausea and vomiting Genitourinary: negative for abnormal menstrual periods, genital lesions, sexual problems and vaginal discharge, dysuria and  urinary incontinence Integument/breast: negative for breast lump, breast tenderness and nipple discharge Hematologic/lymphatic: negative for bleeding and easy bruising Musculoskeletal:negative for back pain and muscle weakness Neurological: negative for dizziness, headaches, vertigo and weakness Endocrine: negative for diabetic symptoms including polydipsia, polyuria and skin dryness Allergic/Immunologic: negative for hay fever and urticaria      Objective:  Blood pressure 108/60, pulse 60, resp. rate 16, height 5\' 9"  (1.753 m), weight 188 lb 3.2 oz (85.4 kg), last menstrual period 09/20/2022.  Body mass index is 27.79 kg/m.    General Appearance:    Alert, cooperative, no distress, appears stated age, overweight  Head:    Normocephalic, without obvious abnormality, atraumatic  Eyes:    PERRL, conjunctiva/corneas clear, EOM's intact, both eyes  Ears:    Normal external ear canals, both ears  Nose:   Nares normal, septum midline, mucosa normal, no drainage or sinus tenderness  Throat:   Lips, mucosa, and tongue normal; teeth and gums normal  Neck:   Supple, symmetrical, trachea midline, no adenopathy; thyroid: no enlargement/tenderness/nodules; no carotid bruit or JVD  Back:     Symmetric, no curvature, ROM normal, no CVA tenderness  Lungs:     Clear to auscultation bilaterally, respirations unlabored  Chest Wall:    No tenderness or deformity   Heart:    Regular rate and rhythm, S1 and S2 normal, no murmur, rub or gallop  Breast Exam:    No tenderness, masses, or nipple abnormality  Abdomen:     Soft, non-tender, bowel sounds active all four quadrants, no masses, no organomegaly.    Genitalia:    Pelvic:external genitalia normal, internal exam deferred as patient currently on menses.   Extremities:   Extremities normal, atraumatic, no cyanosis or edema  Pulses:   2+ and symmetric all extremities  Skin:   Skin color, texture, turgor normal, no rashes or lesions  Lymph nodes:   Cervical,  supraclavicular, and axillary nodes normal  Neurologic:   CNII-XII intact, normal strength, sensation and reflexes throughout   .  Labs:  Reviewed in Care Everywhere.   Assessment:   1. Surveillance for birth control, oral contraceptives   2. Encounter for surveillance of contraceptive pills      Plan:  Blood tests: None ordered.  Breast self exam technique reviewed and patient encouraged to perform self-exam monthly. Contraception: OCP (estrogen/progesterone). Refill given on contraception.  Discussed healthy lifestyle modifications. Mammogram  : Not age appropriate Pap smear  UTD . COVID vaccination status: declines Flu vaccine: declines Follow up in 1 year for annual exam   09/22/2022, MD [facility]

## 2022-09-23 ENCOUNTER — Encounter: Payer: Self-pay | Admitting: Obstetrics and Gynecology

## 2023-02-26 ENCOUNTER — Ambulatory Visit: Payer: Medicaid Other

## 2023-07-31 ENCOUNTER — Encounter: Payer: Self-pay | Admitting: Obstetrics and Gynecology

## 2023-07-31 ENCOUNTER — Ambulatory Visit (INDEPENDENT_AMBULATORY_CARE_PROVIDER_SITE_OTHER): Payer: Medicaid Other | Admitting: Obstetrics and Gynecology

## 2023-07-31 VITALS — BP 135/85 | HR 66 | Ht 69.0 in | Wt 186.3 lb

## 2023-07-31 DIAGNOSIS — N83209 Unspecified ovarian cyst, unspecified side: Secondary | ICD-10-CM

## 2023-07-31 DIAGNOSIS — N83201 Unspecified ovarian cyst, right side: Secondary | ICD-10-CM

## 2023-07-31 NOTE — Progress Notes (Signed)
Patient presenting today to follow-up from a recent ultrasound. She states continuing to have some pelvic pain but not as bad as before, still occasionally has right sided stabbing pain.

## 2023-07-31 NOTE — Progress Notes (Signed)
HPI:      [name] is a 32 y.o. [id number] who LMP was Patient's last menstrual period was 07/25/2023.  Subjective:   She presents today for a follow-up of right-sided pelvic pain and subsequent diagnosis of hemorrhagic right ovarian cyst.  She had an ultrasound at [facility] which showed the cyst.  She reports that her pain is somewhat better at this time but not completely gone.  It is not disabling and she is able to function normally.    Hx: The following portions of the patient's history were reviewed and updated as appropriate:             She  has a past medical history of Abnormal Pap smear of cervix, History of Papanicolaou smear of cervix (05/10/2015; [id number]), and Immunization, viral disease. She does not have any pertinent problems on file. She  has a past surgical history that includes Augmentation mammaplasty and Colposcopy (12/19/2010). Her family history includes Aneurysm in her brother; Atrial fibrillation in her father and sister; Cancer in her paternal grandfather; Diabetes in her maternal grandmother and mother. She  reports that she has never smoked. She has never used smokeless tobacco. She reports that she does not currently use alcohol. She reports that she does not use drugs. She has a current medication list which includes the following prescription(s): escitalopram and norgestimate-ethinyl estradiol triphasic. She has No Known Allergies.       Review of Systems:  Review of Systems  Constitutional: Denied constitutional symptoms, night sweats, recent illness, fatigue, fever, insomnia and weight loss.  Eyes: Denied eye symptoms, eye pain, photophobia, vision change and visual disturbance.  Ears/Nose/Throat/Neck: Denied ear, nose, throat or neck symptoms, hearing loss, nasal discharge, sinus congestion and sore throat.  Cardiovascular: Denied cardiovascular symptoms, arrhythmia, chest pain/pressure, edema, exercise intolerance, orthopnea and palpitations.   Respiratory: Denied pulmonary symptoms, asthma, pleuritic pain, productive sputum, cough, dyspnea and wheezing.  Gastrointestinal: Denied, gastro-esophageal reflux, melena, nausea and vomiting.  Genitourinary: See HPI for additional information.  Musculoskeletal: Denied musculoskeletal symptoms, stiffness, swelling, muscle weakness and myalgia.  Dermatologic: Denied dermatology symptoms, rash and scar.  Neurologic: Denied neurology symptoms, dizziness, headache, neck pain and syncope.  Psychiatric: Denied psychiatric symptoms, anxiety and depression.  Endocrine: Denied endocrine symptoms including hot flashes and night sweats.   Meds:   Current Outpatient Medications on File Prior to Visit  Medication Sig Dispense Refill   escitalopram (LEXAPRO) 10 MG tablet Take 10 mg by mouth daily.     Norgestimate-Ethinyl Estradiol Triphasic (TRI-ESTARYLLA) 0.18/0.215/0.25 MG-35 MCG tablet Take 1 tablet by mouth daily. 84 tablet 3   No current facility-administered medications on file prior to visit.      Objective:     Vitals:   07/31/23 1351  BP: 135/85  Pulse: 66   Filed Weights   07/31/23 1351  Weight: 186 lb 4.8 oz (84.5 kg)              Ultrasound results reviewed directly with the patient          Assessment:    G3P2012 Patient Active Problem List   Diagnosis Date Noted   Anxiety 06/03/2021     1. Hemorrhagic ovarian cyst     Cyst fairly small and seems to be resolving based on patient's symptoms.   Plan:            1.  We have discussed ovarian cyst in detail.  Hemorrhagic cyst specifically discussed.  She also had some questions about  adenomyosis and we had also discussed this condition.  She is taking OCPs at this time having normal regular cycles. She reports her cycles are not heavy.  She plans to stay on the OCPs.  2.  No follow-up is warranted at this time.  If her pain completely resolved nothing to do.  If she continues to experience pain over the next 6  weeks follow-up ultrasound is warranted.  She will contact us. Orders No orders of the defined types were placed in this encounter.   No orders of the defined types were placed in this encounter.     F/U  No follow-ups on file. I spent 24 minutes involved in the care of this patient preparing to see the patient by obtaining and reviewing her medical history (including labs, imaging tests and prior procedures), documenting clinical information in the electronic health record (EHR), counseling and coordinating care plans, writing and sending prescriptions, ordering tests or procedures and in direct communicating with the patient and medical staff discussing pertinent items from her history and physical exam.  Elonda Husky, M.D. 07/31/2023 2:33 PM

## 2023-08-28 ENCOUNTER — Other Ambulatory Visit: Payer: Self-pay | Admitting: Obstetrics and Gynecology

## 2023-08-28 DIAGNOSIS — Z3041 Encounter for surveillance of contraceptive pills: Secondary | ICD-10-CM

## 2023-08-29 MED ORDER — NORGESTIM-ETH ESTRAD TRIPHASIC 0.18/0.215/0.25 MG-35 MCG PO TABS
1.0000 | ORAL_TABLET | Freq: Every day | ORAL | 0 refills | Status: DC
Start: 2023-08-29 — End: 2023-08-30

## 2023-08-29 NOTE — Telephone Encounter (Signed)
Patient is following up on bc refill request. She took last pill last night. Next AE due after 09/23/23. Per front desk January schedules are not open yet. Patient placed on wait list and advised to contact us back in about 2 weeks to get scheduled. She prefers to see Erskine Squibb. Advised will send refill.

## 2023-08-29 NOTE — Addendum Note (Signed)
Addended by: Kathlene Cote on: 08/29/2023 02:10 PM   Modules accepted: Orders

## 2023-08-30 ENCOUNTER — Other Ambulatory Visit: Payer: Self-pay

## 2023-08-30 DIAGNOSIS — Z3041 Encounter for surveillance of contraceptive pills: Secondary | ICD-10-CM

## 2023-08-30 MED ORDER — NORGESTIM-ETH ESTRAD TRIPHASIC 0.18/0.215/0.25 MG-35 MCG PO TABS: 1.0000 | ORAL_TABLET | Freq: Every day | ORAL | 0 refills | Status: DC

## 2023-10-23 ENCOUNTER — Telehealth: Payer: Self-pay

## 2023-10-23 NOTE — Telephone Encounter (Signed)
Left voicemail to notify patient. Advised for her to contact pharmacy regarding refill with discount card if she can not find her last pack of pills from 08/29/23 fill.

## 2023-10-23 NOTE — Telephone Encounter (Signed)
Spoke with pharmacist who advised new rx is on file. Patient last filled #[phone] 08/29/23. It's too early to fill. Insurance will not authorize refill until 10/27/22. They can fill with a discount card if needed. Advised will notify patient.

## 2023-10-23 NOTE — Telephone Encounter (Signed)
Patient advised she has scheduled her annual for 11/16/23. She is out of birth control. Requesting refill. Advised #[phone] sent on 08/29/24. Advised will call pharmacy to clarify rx is still on file.

## 2023-11-16 ENCOUNTER — Encounter: Payer: Self-pay | Admitting: Advanced Practice Midwife

## 2023-11-16 ENCOUNTER — Ambulatory Visit: Payer: Medicaid Other | Admitting: Advanced Practice Midwife

## 2023-11-16 VITALS — BP 95/65 | HR 60 | Ht 68.0 in | Wt 191.0 lb

## 2023-11-16 DIAGNOSIS — Z01419 Encounter for gynecological examination (general) (routine) without abnormal findings: Secondary | ICD-10-CM | POA: Diagnosis not present

## 2023-11-16 DIAGNOSIS — Z3041 Encounter for surveillance of contraceptive pills: Secondary | ICD-10-CM

## 2023-11-16 DIAGNOSIS — Z0000 Encounter for general adult medical examination without abnormal findings: Secondary | ICD-10-CM

## 2023-11-16 MED ORDER — NORGESTIM-ETH ESTRAD TRIPHASIC 0.18/0.215/0.25 MG-35 MCG PO TABS: 1.0000 | ORAL_TABLET | Freq: Every day | ORAL | 4 refills | Status: DC

## 2023-11-16 NOTE — Progress Notes (Signed)
 [facility]   Gynecology Annual Exam   PCP: Patient, No Pcp Per  Chief Complaint:  Chief Complaint  Patient presents with   Gynecologic Exam    No concerns    History of Present Illness: Patient is a 33 y.o. [id number] presents for annual exam. The patient has no gyn complaints today. She mentions a 30 pound weight gain in the past 2 years with winter time being more difficult for diet control. She is now trying to decrease carbs. She has recently started Pilates and she is taking Berberine to help with weight loss/gut health. Has seen provider at [facility] for weight loss management.   She mentions a 4 cm right ovarian cyst diagnosed at [facility] in October of last year. No pain currently.  LMP: Patient's last menstrual period was 11/14/2023 (exact date). Average Interval: regular, 28 days Duration of flow: 5 days Heavy Menses: no Clots: no Intermenstrual Bleeding: no Postcoital Bleeding: no Dysmenorrhea: no  The patient is sexually active. She currently uses OCP (estrogen/progesterone) for contraception. She denies dyspareunia.  The patient does perform self breast exams.  There is no notable family history of breast or ovarian cancer in her family.  The patient wears seatbelts: yes.   The patient has regular exercise: active with children and recently started Pilates. She admits adequate hydration and she usually has 6 hours of sleep.    The patient denies current symptoms of depression.    Review of Systems: Review of Systems  Constitutional:  Negative for chills and fever.       Positive for weight gain  HENT:  Negative for congestion, ear discharge, ear pain, hearing loss, sinus pain and sore throat.   Eyes:  Negative for blurred vision and double vision.  Respiratory:  Negative for cough, shortness of breath and wheezing.   Cardiovascular:  Negative for chest pain, palpitations and leg swelling.  Gastrointestinal:  Negative for abdominal pain, blood in stool, constipation,  diarrhea, heartburn, melena, nausea and vomiting.  Genitourinary:  Negative for dysuria, flank pain, frequency, hematuria and urgency.  Musculoskeletal:  Negative for back pain, joint pain and myalgias.  Skin:  Negative for itching and rash.  Neurological:  Negative for dizziness, tingling, tremors, sensory change, speech change, focal weakness, seizures, loss of consciousness, weakness and headaches.  Endo/Heme/Allergies:  Negative for environmental allergies. Does not bruise/bleed easily.  Psychiatric/Behavioral:  Negative for depression, hallucinations, memory loss, substance abuse and suicidal ideas. The patient is not nervous/anxious and does not have insomnia.     Past Medical History:  Patient Active Problem List   Diagnosis Date Noted Date Diagnosed   Anxiety 06/03/2021     Past Surgical History:  Past Surgical History:  Procedure Laterality Date   AUGMENTATION MAMMAPLASTY     COLPOSCOPY  12/19/2010   lgsil    Gynecologic History:  Patient's last menstrual period was 11/14/2023 (exact date). Contraception: OCP (estrogen/progesterone) Last Pap: 2022 Results were: no abnormalities   Obstetric History: [id number]  Family History:  Family History  Problem Relation Age of Onset   Diabetes Mother        Type 2   Atrial fibrillation Father    Diabetes Maternal Grandmother    Atrial fibrillation Sister    Aneurysm Brother        brain - half brother   Cancer Paternal Grandfather        lung    Social History:  Social History   Socioeconomic History   Marital status: Single  Spouse name: Not on file   Number of children: Not on file   Years of education: 14   Highest education level: Not on file  Occupational History   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  Tobacco Use   Smoking status: Never   Smokeless tobacco: Never  Vaping Use   Vaping status: Never Used  Substance and Sexual Activity   Alcohol use: Yes    Comment: occ   Drug use: No   Sexual activity: Yes     Birth control/protection: Pill  Other Topics Concern   Not on file  Social History Narrative   Not on file   Social Drivers of Health   Financial Resource Strain: Low Risk  (01/27/2022)   [facility]   Overall Financial Resource Strain (CARDIA)    Difficulty of Paying Living Expenses: Not very hard  Food Insecurity: No Food Insecurity (09/12/2023)   Received from [facility]   Hunger Vital Sign    Worried About Running Out of Food in the Last Year: Never true    Ran Out of Food in the Last Year: Never true  Transportation Needs: No Transportation Needs (09/12/2023)   Received from [facility]   PRAPARE - Transportation    Lack of Transportation (Medical): No    Lack of Transportation (Non-Medical): No  Physical Activity: Not on file  Stress: Not on file  Social Connections: Not on file  Intimate Partner Violence: Not on file    Allergies:  No Known Allergies  Medications: Prior to Admission medications   Medication Sig Start Date End Date Taking? Authorizing Provider  escitalopram (LEXAPRO) 10 MG tablet Take 10 mg by mouth daily. 09/01/21  Yes [provider]  fluticasone (FLONASE) 50 MCG/ACT nasal spray 2 sprays into each nostril daily. 12/25/22 12/25/23 Yes [provider]  ibuprofen (ADVIL) 600 MG tablet Take by mouth. 02/10/21  Yes [provider]  Multiple Vitamin (MULTI-VITAMIN) tablet Take 1 tablet by mouth daily.   Yes [provider]  Norgestimate-Ethinyl Estradiol Triphasic (TRI-ESTARYLLA) 0.18/0.215/0.25 MG-35 MCG tablet Take 1 tablet by mouth daily. 11/16/23   Tresea Mall, CNM    Physical Exam Vitals: Blood pressure 95/65, pulse 60, height 5\' 8"  (1.727 m), weight 191 lb (86.6 kg), last menstrual period 11/14/2023.  General: NAD HEENT: normocephalic, anicteric Thyroid: no enlargement, no palpable nodules Pulmonary: No increased work of breathing, CTAB Cardiovascular: RRR, distal pulses  2+ Breast: Breast symmetrical, no tenderness, no palpable nodules or masses, no skin or nipple retraction present, no nipple discharge.  No axillary or supraclavicular lymphadenopathy. Abdomen: NABS, soft, non-tender, non-distended.  Umbilicus without lesions.  No hepatomegaly, splenomegaly or masses palpable. No evidence of hernia  Genitourinary: deferred for PAP interval/no concerns Extremities: no edema, erythema, or tenderness Neurologic: Grossly intact Psychiatric: mood appropriate, affect full   Assessment: 33 y.o. [id number] routine annual exam  Plan: Problem List Items Addressed This Visit   None Visit Diagnoses       Well woman exam without gynecological exam    -  Primary     Encounter for surveillance of contraceptive pills       Relevant Medications   Norgestimate-Ethinyl Estradiol Triphasic (TRI-ESTARYLLA) 0.18/0.215/0.25 MG-35 MCG tablet       1) STI screening  was offered and declined  2)  ASCCP guidelines and rationale discussed.  Patient opts for every 5 years screening interval  3) Contraception - the patient is currently using  OCP (estrogen/progesterone).  She  is happy with her current form of contraception and plans to continue  4) Routine healthcare maintenance including cholesterol, diabetes screening discussed Declines  5) Return in about 1 year (around 11/15/2024) for annual established gyn.   Tresea Mall, CNM [facility] 11/16/2023 5:24 PM

## 2023-11-16 NOTE — Patient Instructions (Signed)
 Mediterranean Diet A Mediterranean diet is based on the traditions of countries on the [employer]. It focuses on eating more: Fruits and vegetables. Whole grains, beans, nuts, and seeds. Heart-healthy fats. These are fats that are good for your heart. It involves eating less: Dairy. Meat and eggs. Processed foods with added sugar, salt, and fat. This type of diet can help prevent certain conditions. It can also improve outcomes if you have a long-term (chronic) disease, such as kidney or heart disease. What are tips for following this plan? Reading food labels Check packaged foods for: The serving size. For foods such as rice and pasta, the serving size is the amount of cooked product, not dry. The total fat. Avoid foods with saturated fat or trans fat. Added sugars, such as corn syrup. Shopping  Try to have a balanced diet. Buy a variety of foods, such as: Fresh fruits and vegetables. You may be able to get these from local farmers markets. You can also buy them frozen. Grains, beans, nuts, and seeds. Some of these can be bought in bulk. Fresh seafood. Poultry and eggs. Low-fat dairy products. Buy whole ingredients instead of foods that have already been packaged. If you can't get fresh seafood, buy precooked frozen shrimp or canned fish, such as tuna, salmon, or sardines. Stock your pantry so you always have certain foods on hand, such as olive oil, canned tuna, canned tomatoes, rice, pasta, and beans. Cooking Cook foods with extra-virgin olive oil instead of using butter or other vegetable oils. Have meat as a side dish. Have vegetables or grains as your main dish. This means having meat in small portions or adding small amounts of meat to foods like pasta or stew. Use beans or vegetables instead of meat in common dishes like chili or lasagna. Try out different cooking methods. Try roasting, broiling, steaming, and sauting vegetables. Add frozen vegetables to soups, stews,  pasta, or rice. Add nuts or seeds for added healthy fats and plant protein at each meal. You can add these to yogurt, salads, or vegetable dishes. Marinate fish or vegetables using olive oil, lemon juice, garlic, and fresh herbs. Meal planning Plan to eat a vegetarian meal one day each week. Try to work up to two vegetarian meals, if possible. Eat seafood two or more times a week. Have healthy snacks on hand. These may include: Vegetable sticks with hummus. Greek yogurt. Fruit and nut trail mix. Eat balanced meals. These should include: Fruit: 2-3 servings a day. Vegetables: 4-5 servings a day. Low-fat dairy: 2 servings a day. Fish, poultry, or lean meat: 1 serving a day. Beans and legumes: 2 or more servings a week. Nuts and seeds: 1-2 servings a day. Whole grains: 6-8 servings a day. Extra-virgin olive oil: 3-4 servings a day. Limit red meat and sweets to just a few servings a month. Lifestyle  Try to cook and eat meals with your family. Drink enough fluid to keep your pee (urine) pale yellow. Be active every day. This includes: Aerobic exercise, which is exercise that causes your heart to beat faster. Examples include running and swimming. Leisure activities like gardening, walking, or housework. Get 7-8 hours of sleep each night. Drink red wine if your provider says you can. A glass of wine is 5 oz (150 mL). You may be allowed to have: Up to 1 glass a day if you're female and not pregnant. Up to 2 glasses a day if you're female. What foods should I eat? Fruits Apples. Apricots. Avocado.  Berries. Bananas. Cherries. Dates. Figs. Grapes. Lemons. Melon. Oranges. Peaches. Plums. Pomegranate. Vegetables Artichokes. Beets. Broccoli. Cabbage. Carrots. Eggplant. Green beans. Chard. Kale. Spinach. Onions. Leeks. Peas. Squash. Tomatoes. Peppers. Radishes. Grains Whole-grain pasta. Brown rice. Bulgur wheat. Polenta. Couscous. Whole-wheat bread. Orpah Cobb. Meats and other  proteins Beans. Almonds. Sunflower seeds. Pine nuts. Peanuts. Cod. Salmon. Scallops. Shrimp. Tuna. Tilapia. Clams. Oysters. Eggs. Chicken or [place] without skin. Dairy Low-fat milk. Cheese. Greek yogurt. Fats and oils Extra-virgin olive oil. Avocado oil. Grapeseed oil. Beverages Water. Red wine. Herbal tea. Sweets and desserts Greek yogurt with honey. Baked apples. Poached pears. Trail mix. Seasonings and condiments Basil. Cilantro. Coriander. Cumin. Mint. Parsley. Sage. Rosemary. Tarragon. Garlic. Oregano. Thyme. Pepper. Balsamic vinegar. Tahini. Hummus. Tomato sauce. Olives. Mushrooms. The items listed above may not be all the foods and drinks you can have. Talk to a dietitian to learn more. What foods should I limit? This is a list of foods that should be eaten rarely. Fruits Fruit canned in syrup. Vegetables Deep-fried potatoes, like [place] fries. Grains Packaged pasta or rice dishes. Cereal with added sugar. Snacks with added sugar. Meats and other proteins Beef. Pork. Lamb. Chicken or [place] with skin. Hot dogs. Tomasa Blase. Dairy Ice cream. Sour cream. Whole milk. Fats and oils Butter. Canola oil. Vegetable oil. Beef fat (tallow). Lard. Beverages Juice. Sugar-sweetened soft drinks. Beer. Liquor and spirits. Sweets and desserts Cookies. Cakes. Pies. Candy. Seasonings and condiments Mayonnaise. Pre-made sauces and marinades. The items listed above may not be all the foods and drinks you should limit. Talk to a dietitian to learn more. Where to find more information American Heart Association (AHA): heart.org This information is not intended to replace advice given to you by your health care provider. Make sure you discuss any questions you have with your health care provider. Document Revised: 12/24/2022 Document Reviewed: 12/24/2022 Elsevier Patient Education  2024 Elsevier Inc.Nonsurgical Weight Loss Treatment Options Nonsurgical weight loss has many different components. You  will learn how to work with a specialized care team to incorporate diet, exercise, and lifestyle changes into your life. To view the content, go to this web address: https://pe.elsevier.[email or web address]  This video will expire on: 09/05/2025. If you need access to this video following this date, please reach out to the healthcare provider who assigned it to you. This information is not intended to replace advice given to you by your health care provider. Make sure you discuss any questions you have with your health care provider. Elsevier Patient Education  2024 [employer].

## 2023-11-29 DIAGNOSIS — N3000 Acute cystitis without hematuria: Secondary | ICD-10-CM | POA: Insufficient documentation

## 2023-12-07 ENCOUNTER — Encounter: Payer: Self-pay | Admitting: Family Medicine

## 2023-12-07 ENCOUNTER — Ambulatory Visit: Admitting: Family Medicine

## 2023-12-07 VITALS — BP 110/69 | HR 64 | Ht 68.0 in | Wt 189.0 lb

## 2023-12-07 DIAGNOSIS — Z6828 Body mass index (BMI) 28.0-28.9, adult: Secondary | ICD-10-CM | POA: Diagnosis not present

## 2023-12-07 DIAGNOSIS — N83201 Unspecified ovarian cyst, right side: Secondary | ICD-10-CM

## 2023-12-07 DIAGNOSIS — N83209 Unspecified ovarian cyst, unspecified side: Secondary | ICD-10-CM

## 2023-12-07 DIAGNOSIS — Z3009 Encounter for other general counseling and advice on contraception: Secondary | ICD-10-CM

## 2023-12-07 DIAGNOSIS — E663 Overweight: Secondary | ICD-10-CM | POA: Diagnosis not present

## 2023-12-07 DIAGNOSIS — Z7689 Persons encountering health services in other specified circumstances: Secondary | ICD-10-CM

## 2023-12-07 MED ORDER — TIRZEPATIDE-WEIGHT MANAGEMENT 2.5 MG/0.5ML ~~LOC~~ SOLN: 2.5000 mg | SUBCUTANEOUS | 2 refills | Status: DC

## 2023-12-07 NOTE — Progress Notes (Signed)
 New Patient Office Visit  Subjective   Patient ID: Diana [name], female    DOB: 01-28-1991  Age: 33 y.o. MRN: [id number]  CC:  Chief Complaint  Patient presents with   Establish Care    Establish care, weight loss    HPI Diana [name] is a 33 year old female who presents to establish with [facility]. She would like to discuss weight loss.  CC: Patient here to establish care  Last PCP: [facility]  Specialists: OBGYN & cards   Weight management: today BMI 28.74 (weight 189lb)  About 2 years ago, noticed weight gain & she would stay around 160. Noticed that she did start Lexapro around this time.  When she had her 2nd child, she gained 37lbs- lost it all through diet & exercise.  She has been involved in piliates & eating better.  Taking OTC vitamins- things for metabolism  Has not seen a change at all in weight  Last provider attempted Topamax but she did not start it   R side abd pain during intercourse occasionally- history of ovarian cysts Last seen in Oct 2024   Outpatient Encounter Medications as of 12/07/2023  Medication Sig   escitalopram (LEXAPRO) 10 MG tablet Take 10 mg by mouth daily.   fluticasone (FLONASE) 50 MCG/ACT nasal spray 2 sprays into each nostril daily.   ibuprofen (ADVIL) 600 MG tablet Take by mouth.   Multiple Vitamin (MULTI-VITAMIN) tablet Take 1 tablet by mouth daily.   Norgestimate-Ethinyl Estradiol Triphasic (TRI-ESTARYLLA) 0.18/0.215/0.25 MG-35 MCG tablet Take 1 tablet by mouth daily.   tirzepatide (ZEPBOUND) 2.5 MG/0.5ML injection vial Inject 2.5 mg into the skin once a week.   No facility-administered encounter medications on file as of 12/07/2023.    Patient Active Problem List   Diagnosis Date Noted   Acute cystitis without hematuria 11/29/2023   Anxiety 06/03/2021   Past Medical History:  Diagnosis Date   Abnormal Pap smear of cervix    History of Papanicolaou smear of cervix 05/10/2015; [id number]   neg; neg    Immunization, viral disease    gardasil completed   Past Surgical History:  Procedure Laterality Date   AUGMENTATION MAMMAPLASTY     COLPOSCOPY  12/19/2010   lgsil   Family History  Problem Relation Age of Onset   Diabetes Mother        Type 2   Atrial fibrillation Father    Diabetes Maternal Grandmother    Atrial fibrillation Sister    Aneurysm Brother        brain - half brother   Cancer Paternal Grandfather        lung   Social History   Socioeconomic History   Marital status: Single    Spouse name: Not on file   Number of children: Not on file   Years of education: 14   Highest education level: Not on file  Occupational History   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IR STYLIST  Tobacco Use   Smoking status: Never    Passive exposure: Never   Smokeless tobacco: Never  Vaping Use   Vaping status: Never Used  Substance and Sexual Activity   Alcohol use: Yes    Comment: occ   Drug use: No   Sexual activity: Yes    Birth control/protection: Pill  Other Topics Concern   Not on file  Social History Narrative   Not on file   Social Drivers of Health   Financial Resource Strain: Low Risk  (01/27/2022)  Received from [facility], [facility]   Overall Financial Resource Strain (CARDIA)    Difficulty of Paying Living Expenses: Not very hard  Food Insecurity: No Food Insecurity (09/12/2023)   Received from [facility]   Hunger Vital Sign    Worried About Running Out of Food in the Last Year: Never true    Ran Out of Food in the Last Year: Never true  Transportation Needs: No Transportation Needs (09/12/2023)   Received from [facility] - Transportation    Lack of Transportation (Medical): No    Lack of Transportation (Non-Medical): No  Physical Activity: Not on file  Stress: Not on file  Social Connections: Not on file  Intimate Partner Violence: Not on file   Outpatient Medications Prior to Visit  Medication Sig Dispense Refill   escitalopram  (LEXAPRO) 10 MG tablet Take 10 mg by mouth daily.     fluticasone (FLONASE) 50 MCG/ACT nasal spray 2 sprays into each nostril daily.     ibuprofen (ADVIL) 600 MG tablet Take by mouth.     Multiple Vitamin (MULTI-VITAMIN) tablet Take 1 tablet by mouth daily.     Norgestimate-Ethinyl Estradiol Triphasic (TRI-ESTARYLLA) 0.18/0.215/0.25 MG-35 MCG tablet Take 1 tablet by mouth daily. 84 tablet 4   No facility-administered medications prior to visit.   No Known Allergies  ROS: see HPI    Objective  Today's Vitals   12/07/23 0916  BP: 110/69  Pulse: 64  SpO2: 95%  Weight: 189 lb (85.7 kg)  Height: 5\' 8"  (1.727 m)  PainSc: 0-No pain   GENERAL: Well-appearing, in NAD. Well nourished.  SKIN: Pink, warm and dry. No rash, lesion, ulceration, or ecchymoses.  Head: Normocephalic. RESPIRATORY: Chest wall symmetrical. Respirations even and non-labored. Breath sounds clear to auscultation bilaterally.  CARDIAC: S1, S2 present, regular rate and rhythm without murmur or gallops. Peripheral pulses 2+ bilaterally.  MSK: Muscle tone and strength appropriate for age. Joints w/o tenderness, redness, or swelling.  EXTREMITIES: Without clubbing, cyanosis, or edema.  NEUROLOGIC: No motor or sensory deficits. Steady, even gait. C2-C12 intact.  PSYCH/MENTAL STATUS: Alert, oriented x 3. Cooperative, appropriate mood and affect.     Assessment & Plan:   1. Encounter to establish care (Primary) Patient is a 33 year old female who presents today to establish care with primary care at [facility]. Reviewed the past medical history, family history, social history, surgical history, medications and allergies today- updates made as indicated. Patient has concerns today about weight loss medication and history of ovarian cysts.    2. Overweight with body mass index (BMI) of 28 to 28.9 in adult Patient would like to start GLP-1 for weight loss management. Discussed options, with shared decision making, patient would  like to start GLP-1 receptor agonist- in addition to lifestyle modifications. Prescription for Zepbound sent to pharmacy of patient's preference. Will start at lowest dose, 2.5mg  once weekly. Discussed potential risks and side effects. Educated patient regarding medication side effects, rotating injection site, demonstrated how to utilize injectable pen, and advised patient to inform us if she presents with any questions or concerns. Discussed that if she is tolerating this well, we can look to gradually increase the dose as soon as 4 weeks.   - tirzepatide (ZEPBOUND) 2.5 MG/0.5ML injection vial; Inject 2.5 mg into the skin once a week.  Dispense: 2 mL; Refill: 2  3. Cyst of right ovary History of ovarian cysts and reports intermittent lower right abdominal pain. She had imaging  last performed in October 2024 with noted right ovarian cyst. Discussed obtaining an ultrasound but patient declined at this time. Advised patient to reach out if pain persists or worsens, along with any other acute symptoms.   4. Birth control counseling Discussed decrease in effectiveness of OCP and other options for birth control- including Depo shot, Nexplanon implant, and IUDs. Patient not interested at this time and will consider.    Return in about 5 weeks (around 01/11/2024) for weight loss .   Spent 45 minutes on this patient encounter, including preparation, chart review, face-to-face counseling with patient and coordination of care, and documentation of encounter.    Alyson Reedy, FNP

## 2023-12-07 NOTE — Patient Instructions (Signed)

## 2023-12-13 ENCOUNTER — Encounter: Payer: Self-pay | Admitting: Family Medicine

## 2023-12-13 DIAGNOSIS — N83201 Unspecified ovarian cyst, right side: Secondary | ICD-10-CM | POA: Insufficient documentation

## 2023-12-13 DIAGNOSIS — Z6828 Body mass index (BMI) 28.0-28.9, adult: Secondary | ICD-10-CM | POA: Insufficient documentation

## 2023-12-13 MED ORDER — TIRZEPATIDE-WEIGHT MANAGEMENT 2.5 MG/0.5ML ~~LOC~~ SOLN: 2.5000 mg | SUBCUTANEOUS | 2 refills | Status: DC

## 2023-12-14 NOTE — Telephone Encounter (Signed)
 Pt following up on request for the zepbound. The pharmacist states the Rx needs to have GLYCINDE added

## 2023-12-18 ENCOUNTER — Other Ambulatory Visit: Payer: Self-pay | Admitting: Family Medicine

## 2023-12-18 DIAGNOSIS — Z6828 Body mass index (BMI) 28.0-28.9, adult: Secondary | ICD-10-CM

## 2023-12-18 MED ORDER — TIRZEPATIDE-WEIGHT MANAGEMENT 2.5 MG/0.5ML ~~LOC~~ SOLN: 2.5000 mg | SUBCUTANEOUS | 2 refills | Status: DC

## 2023-12-18 NOTE — Telephone Encounter (Signed)
 Copied from CRM [id number]. Topic: Clinical - Prescription Issue >> Dec 18, 2023  2:46 PM Franchot Heidelberg wrote: Reason for CRM: Pt called following up on Zepbound request, wants a call back today from the clinic for an update. Says she has been waiting for over a week. Please advise   Best contact: [phone]

## 2024-01-04 ENCOUNTER — Ambulatory Visit: Admitting: Licensed Practical Nurse

## 2024-01-04 VITALS — BP 124/74 | HR 61 | Ht 68.0 in | Wt 186.2 lb

## 2024-01-04 DIAGNOSIS — Z3009 Encounter for other general counseling and advice on contraception: Secondary | ICD-10-CM

## 2024-01-04 DIAGNOSIS — Z30015 Encounter for initial prescription of vaginal ring hormonal contraceptive: Secondary | ICD-10-CM

## 2024-01-04 MED ORDER — ETONOGESTREL-ETHINYL ESTRADIOL 0.12-0.015 MG/24HR VA RING: VAGINAL_RING | VAGINAL | 12 refills | Status: AC

## 2024-01-04 NOTE — Progress Notes (Signed)
 Alyson Reedy, FNP   Chief Complaint  Patient presents with   Contraception    HPI:      Diana [name] is a 33 y.o. [id number] whose LMP was Patient's last menstrual period was 12/12/2023 (approximate)., presents today to discuss birth control options. She recently started Zepbound for weight loss. She was told Oral contraceptives maybe less effective while on Zepbound. Diana [name] does not desire any future pregnancies but she does not want a tubal ligation. She is interested in a method that gives her control and that she has a monthly cycle as this is reassuring to her that she is not pregnant. She does not use tobacco or nicotine, denies hx of CHTN or migraines with aura. She would prefer a method that does not cause weight gain.   Per Zepbound website: Zepbound delays gastric emptying and thereby has the potential to impact the absorption of concomitantly administered oral medications.   Patient Active Problem List   Diagnosis Date Noted   Overweight with body mass index (BMI) of 28 to 28.9 in adult 12/13/2023   Cyst of right ovary 12/13/2023   Anxiety 06/03/2021    Past Surgical History:  Procedure Laterality Date   AUGMENTATION MAMMAPLASTY     COLPOSCOPY  12/19/2010   lgsil    Family History  Problem Relation Age of Onset   Diabetes Mother        Type 2   Atrial fibrillation Father    Diabetes Maternal Grandmother    Atrial fibrillation Sister    Aneurysm Brother        brain - half brother   Cancer Paternal Grandfather        lung    Social History   Socioeconomic History   Marital status: Single    Spouse name: Not on file   Number of children: Not on file   Years of education: 14   Highest education level: Not on file  Occupational History   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  Tobacco Use   Smoking status: Never    Passive exposure: Never   Smokeless tobacco: Never  Vaping Use   Vaping status: Never Used  Substance and Sexual Activity   Alcohol use: Yes     Comment: occ   Drug use: No   Sexual activity: Yes    Birth control/protection: Pill  Other Topics Concern   Not on file  Social History Narrative   Not on file   Social Drivers of Health   Financial Resource Strain: Low Risk  (01/27/2022)   Received from [facility]   Overall Financial Resource Strain (CARDIA)    Difficulty of Paying Living Expenses: Not very hard  Food Insecurity: No Food Insecurity (09/12/2023)   Received from [facility]   Hunger Vital Sign    Worried About Running Out of Food in the Last Year: Never true    Ran Out of Food in the Last Year: Never true  Transportation Needs: No Transportation Needs (09/12/2023)   Received from [facility]   PRAPARE - Transportation    Lack of Transportation (Medical): No    Lack of Transportation (Non-Medical): No  Physical Activity: Not on file  Stress: Not on file  Social Connections: Not on file  Intimate Partner Violence: Not on file    Outpatient Medications Prior to Visit  Medication Sig Dispense Refill   escitalopram (LEXAPRO) 10 MG tablet Take 10 mg by mouth daily.  ibuprofen (ADVIL) 600 MG tablet Take by mouth.     Multiple Vitamin (MULTI-VITAMIN) tablet Take 1 tablet by mouth daily.     tirzepatide (ZEPBOUND) 2.5 MG/0.5ML injection vial Inject 2.5 mg into the skin once a week. 2.125 mL 2   Norgestimate-Ethinyl Estradiol Triphasic (TRI-ESTARYLLA) 0.18/0.215/0.25 MG-35 MCG tablet Take 1 tablet by mouth daily. 84 tablet 4   No facility-administered medications prior to visit.      ROS:  Review of Systems see HPI    OBJECTIVE:   Vitals:  BP 124/74 (BP Location: Left Arm, Patient Position: Sitting, Cuff Size: Large)   Pulse 61   Ht 5\' 8"  (1.727 m)   Wt 186 lb 3.2 oz (84.5 kg)   LMP 12/12/2023 (Approximate)   BMI 28.31 kg/m   Physical Exam Constitutional:      Appearance: Normal appearance.  Cardiovascular:     Rate and Rhythm: Normal rate.  Pulmonary:     Breath  sounds: Normal breath sounds.  Neurological:     General: No focal deficit present.     Mental Status: She is alert.    PE not necessary for purpose of visit     Assessment/Plan: Encounter for initial prescription of vaginal ring hormonal contraceptive - Plan: etonogestrel-ethinyl estradiol (NUVARING) 0.12-0.015 MG/24HR vaginal ring    Meds ordered this encounter  Medications   etonogestrel-ethinyl estradiol (NUVARING) 0.12-0.015 MG/24HR vaginal ring    Sig: Insert vaginally and leave in place for 3 consecutive weeks, then remove for 1 week.    Dispense:  1 each    Refill:  12     Discussed all non oral methods of contraception. Risk/benefits/side effects reviewed. Diana [name] would like to try the Nuva ring, she may consider the hormonal IUD if she does not like the Nuvaring. She will start her placebo week on Sunday and then start the Nuva ring a week after that.   Diana [name] [facility], CNM 01/04/2024 4:41 PM

## 2024-01-18 ENCOUNTER — Ambulatory Visit (INDEPENDENT_AMBULATORY_CARE_PROVIDER_SITE_OTHER): Admitting: Family Medicine

## 2024-01-18 ENCOUNTER — Encounter: Payer: Self-pay | Admitting: Family Medicine

## 2024-01-18 VITALS — BP 98/67 | HR 66 | Temp 98.2°F | Resp 16 | Ht 68.0 in | Wt 178.8 lb

## 2024-01-18 DIAGNOSIS — E663 Overweight: Secondary | ICD-10-CM

## 2024-01-18 DIAGNOSIS — R42 Dizziness and giddiness: Secondary | ICD-10-CM | POA: Diagnosis not present

## 2024-01-18 DIAGNOSIS — L719 Rosacea, unspecified: Secondary | ICD-10-CM

## 2024-01-18 DIAGNOSIS — Z6827 Body mass index (BMI) 27.0-27.9, adult: Secondary | ICD-10-CM | POA: Diagnosis not present

## 2024-01-18 MED ORDER — METRONIDAZOLE 1 % EX GEL: Freq: Every day | CUTANEOUS | 0 refills | Status: DC

## 2024-01-18 NOTE — Progress Notes (Signed)
   Established Patient Office Visit  Subjective  Patient ID: Diana [name], female    DOB: 1991-04-04  Age: 33 y.o. MRN: [id number]  Chief Complaint  Patient presents with   Follow-up    Pt. Here for a f/u on weight management.    Weight Loss Follow-Up: Last OV BMI 28.74 (189lb) Today BMI 27.19 (178lb)  Zepbound 2.5mg  weekly  Doing well on current dose- no side effects  First couple of weeks did not have an appetite  Not eating as much, increasing protein, feeling fuller quicker  Plans to exercise to tone more   ROS: see HPI    Objective:    BP 98/67   Pulse 66   Temp 98.2 F (36.8 C) (Oral)   Resp 16   Ht 5\' 8"  (1.727 m)   Wt 178 lb 12.8 oz (81.1 kg)   LMP 01/02/2024 (Approximate)   SpO2 96%   BMI 27.19 kg/m  BP Readings from Last 3 Encounters:  01/18/24 98/67  01/04/24 124/74  12/07/23 110/69    Physical Exam Vitals reviewed.  Constitutional:      Appearance: Normal appearance.  Cardiovascular:     Rate and Rhythm: Normal rate and regular rhythm.     Pulses: Normal pulses.     Heart sounds: Normal heart sounds.  Pulmonary:     Effort: Pulmonary effort is normal.     Breath sounds: Normal breath sounds.  Neurological:     Mental Status: She is alert.  Psychiatric:        Mood and Affect: Mood normal.        Behavior: Behavior normal.     Assessment & Plan:   1. Overweight with body mass index (BMI) of 27 to 27.9 in adult (Primary) Patient presents today for weight loss follow-up. She has lost 11lbs within the past 4 weeks. She is currently on a Zepbound compound 2.5mg  weekly and is tolerating this medication well. She reports a decrease in her portion size and feeling full quicker. She would like to continue on the same dose, no refills needed at this time. Discussed increasing dose if she notices an increase in her appetite/hunger cues and increase in portion size. Plan to follow-up in 4 weeks.   2. Orthostatic lightheadedness Review of vital signs  today. Blood pressure and heart rate are on the low end of normal. Discussed importance of adequate hydration and electrolyte intake and consuming well-balanced diet. Discussed changing positions slowly. Advised patient to reach out if these symptoms continue or worsen sooner than her follow-up appointment.   3. Rosacea Patient would like refill of topical gel for treatment of rosacea. Rx sent to pharmacy on file.  - metroNIDAZOLE  (METROGEL ) 1 % gel; Apply topically daily.  Dispense: 60 g; Refill: 0     Return in about 4 weeks (around 02/15/2024) for weight loss f/u .    Diana Hanson, FNP

## 2024-01-25 ENCOUNTER — Encounter: Payer: Self-pay | Admitting: Family Medicine

## 2024-02-15 ENCOUNTER — Ambulatory Visit

## 2024-02-15 ENCOUNTER — Other Ambulatory Visit (HOSPITAL_COMMUNITY)
Admission: RE | Admit: 2024-02-15 | Discharge: 2024-02-15 | Disposition: A | Discharge status: Home / Self Care | Source: Ambulatory Visit | Attending: Obstetrics and Gynecology | Admitting: Obstetrics and Gynecology

## 2024-02-15 ENCOUNTER — Ambulatory Visit (INDEPENDENT_AMBULATORY_CARE_PROVIDER_SITE_OTHER): Admitting: Family Medicine

## 2024-02-15 VITALS — BP 122/78 | HR 53 | Ht 68.0 in | Wt 169.9 lb

## 2024-02-15 VITALS — BP 110/73 | HR 53 | Ht 68.0 in | Wt 170.8 lb

## 2024-02-15 DIAGNOSIS — I8393 Asymptomatic varicose veins of bilateral lower extremities: Secondary | ICD-10-CM | POA: Insufficient documentation

## 2024-02-15 DIAGNOSIS — Z6828 Body mass index (BMI) 28.0-28.9, adult: Secondary | ICD-10-CM

## 2024-02-15 DIAGNOSIS — R8290 Unspecified abnormal findings in urine: Secondary | ICD-10-CM

## 2024-02-15 DIAGNOSIS — E663 Overweight: Secondary | ICD-10-CM

## 2024-02-15 DIAGNOSIS — R399 Unspecified symptoms and signs involving the genitourinary system: Secondary | ICD-10-CM | POA: Diagnosis not present

## 2024-02-15 DIAGNOSIS — Z202 Contact with and (suspected) exposure to infections with a predominantly sexual mode of transmission: Secondary | ICD-10-CM | POA: Diagnosis present

## 2024-02-15 LAB — POCT URINALYSIS DIPSTICK OB
Bilirubin, UA: NEGATIVE
Glucose, UA: NEGATIVE
Ketones, UA: NEGATIVE
Leukocytes, UA: NEGATIVE
Nitrite, UA: NEGATIVE
Spec Grav, UA: 1.015 (ref 1.010–1.025)
Urobilinogen, UA: NEGATIVE U/dL — AB
pH, UA: 5 (ref 5.0–8.0)

## 2024-02-15 MED ORDER — TIRZEPATIDE-WEIGHT MANAGEMENT 2.5 MG/0.5ML ~~LOC~~ SOLN: 2.5000 mg | SUBCUTANEOUS | 1 refills | Status: DC

## 2024-02-15 NOTE — Progress Notes (Addendum)
    NURSE VISIT NOTE  Subjective:    Patient ID: JESILYN EASOM, female    DOB: [birth year], 33 y.o.   MRN: [id number]  HPI  Patient is a 33 y.o. G9P2012 female who presents for UTI and STD testing.  She says her partner tested positive for a UTI and yeast and his doctor told him his sexual partner will also need to be tested and treated for both.  She is asymptomatic.  Denies abnormal vaginal bleeding or significant pelvic pain or fever.    Objective:    BP 122/78   Pulse (!) 53   Wt 169 lb 14.4 oz (77.1 kg)   LMP 01/02/2024 (Approximate)   BMI 25.83 kg/m      Assessment:   1. Encounter for assessment of STD exposure   2. Abnormal urinalysis     Plan:   Aptima swab for Yeast, BV, Trich, Gonorrhea and Chlamydia sent to lab.  Urine sent for culture.  Patient declines HIV and RPR testing.  Clinic will call with abnormal results.  Abstain from intercourse until results return.   Juanita Norlander, RN

## 2024-02-15 NOTE — Progress Notes (Signed)
   Established Patient Office Visit  Subjective  Patient ID: Diana [name], female    DOB: 05/31/1991  Age: 33 y.o. MRN: [id number]  Chief Complaint  Patient presents with   Weight Loss   Weight Loss Follow-Up:  Original BMI 28.74 (189 lbs) Last BMI 27.19 (178lbs) Today BMI 25.97 (170.8 lbs) Has lost 19lbs since starting medication Reports she is feeling more tired recently.  She is staying full longer, smaller portions, cannot eat or drink much.  Reports some constipation but has always had this issue.  Goal weight: 150lbs  Patient reports she has issues with varicose veins and has noticed that she has a dull ache in her lower extremities behind both calves. Reports dull ache that comes and goes. Thinks she also has poor circulation.  Denies unilateral/or peripheral edema, redness, warmth, and pain with ambulation.  Feels like she has poor circulation- cold toes and feet   ROS: see HPI    Objective:      BP 110/73   Pulse (!) 53   Ht 5\' 8"  (1.727 m)   Wt 170 lb 12.8 oz (77.5 kg)   LMP 02/01/2024 (Approximate)   SpO2 98%   BMI 25.97 kg/m  BP Readings from Last 3 Encounters:  02/15/24 110/73  02/15/24 122/78  01/18/24 98/67     Physical Exam    Assessment & Plan:   1. Overweight with body mass index (BMI) of 28 to 28.9 in adult (Primary) Patient presents today for weight loss follow-up.  She has lost 19 pounds since starting on Zepbound compound.  She reports that she has been tolerating the 2.5 mg weekly and has been continuing to lose weight.  She reports that she still feels like she is staying fuller longer, consuming smaller portions and not having any food noise.  Discussed staying at 2.5 mg dose since she continues to lose weight and reports that she has not noticed an increase in her appetite or increase in hunger cues.  Advised her to inform me if she would like to increase the dose to 5mg  weekly if she starts to develop the symptoms. Rx sent to pharmacy  on file.  - tirzepatide (ZEPBOUND) 2.5 MG/0.5ML injection vial; Inject 2.5 mg into the skin once a week.  Dispense: 2.125 mL; Refill: 1  2. Asymptomatic varicose veins of both lower extremities Patient would like referral to vascular specialist. Referral placed.  - Ambulatory referral to Vascular Surgery   Return in about 4 weeks (around 03/14/2024) for weight loss f/u.    [name], FNP

## 2024-02-15 NOTE — Patient Instructions (Signed)
 MyChart:  For all urgent or time sensitive needs we ask that you please call the office to avoid delays. Our number is [phone]) [phone]. MyChart is not constantly monitored and due to the large volume of messages a day, replies may take up to 72 business hours.   MyChart Policy: MyChart allows for you to see your visit notes, after visit summary, provider recommendations, lab and tests results, make an appointment, request refills, and contact your provider or the office for non-urgent questions or concerns. Providers are seeing patients during normal business hours and do not have built in time to review MyChart messages.  We ask that you allow a minimum of 3 business days for responses to [employer]. For this reason, please do not send urgent requests through MyChart. Please call the office at [phone]. New and ongoing conditions may require a visit. We have virtual and in person visit available for your convenience.  Complex MyChart concerns may require a visit. Your provider may request you schedule a virtual or in person visit to ensure we are providing the best care possible. MyChart messages sent after 11:00 AM on Friday will not be received by the provider until Monday morning.    Lab and Test Results: You will receive your lab and test results on MyChart as soon as they are completed and results have been sent by the lab or testing facility. Due to this service, you will receive your results BEFORE your provider.  I review lab and tests results each morning prior to seeing patients. Some results require collaboration with other providers to ensure you are receiving the most appropriate care. For this reason, we ask that you please allow a minimum of 3-5 business days from the time the ALL results have been received for your provider to receive and review lab and test results and contact you about these.  Most lab and test result comments from the provider will be sent through MyChart.  Your provider may recommend changes to the plan of care, follow-up visits, repeat testing, ask questions, or request an office visit to discuss these results. You may reply directly to this message or call the office at [phone] to provide information for the provider or set up an appointment. In some instances, you will be called with test results and recommendations. Please let us know if this is preferred and we will make note of this in your chart to provide this for you.    If you have not heard a response to your lab or test results in 5 business days from all results returning to MyChart, please call the office to let us know. We ask that you please avoid calling prior to this time unless there is an emergent concern. Due to high call volumes, this can delay the resulting process.   After Hours: For all non-emergency after hours needs, please call the office at [phone] and select the option to reach the on-call provider service. On-call services are shared between multiple [place] offices and therefore it will not be possible to speak directly with your provider. On-call providers may provide medical advice and recommendations, but are unable to provide refills for maintenance medications.  For all emergency or urgent medical needs after normal business hours, we recommend that you seek care at the closest Urgent Care or Emergency Department to ensure appropriate treatment in a timely manner.  [facility] has a 24 hour emergency room located on the ground floor for your  convenience.    Urgent Concerns During the Business Day Providers are seeing patients from 8AM to 5PM, Monday through Thursday, and 8AM to 12PM on Friday with a busy schedule and are most often not able to respond to non-urgent calls until the end of the day or the next business day. If you should have URGENT concerns during the day, please call and speak to the nurse or schedule a same day  appointment so that we can address your concern without delay.    Thank you, again, for choosing me as your health care partner. I appreciate your trust and look forward to learning more about you.    Diana Reedy, FNP-C

## 2024-02-17 LAB — URINE CULTURE: Organism ID, Bacteria: NO GROWTH

## 2024-02-19 ENCOUNTER — Encounter: Payer: Self-pay | Admitting: Licensed Practical Nurse

## 2024-02-19 DIAGNOSIS — B379 Candidiasis, unspecified: Secondary | ICD-10-CM

## 2024-02-19 LAB — CERVICOVAGINAL ANCILLARY ONLY
Bacterial Vaginitis (gardnerella): NEGATIVE
Candida Glabrata: NEGATIVE
Candida Vaginitis: POSITIVE — AB
Chlamydia: NEGATIVE
Comment: NEGATIVE
Comment: NEGATIVE
Comment: NEGATIVE
Comment: NEGATIVE
Comment: NEGATIVE
Comment: NORMAL
Neisseria Gonorrhea: NEGATIVE
Trichomonas: NEGATIVE

## 2024-02-19 MED ORDER — FLUCONAZOLE 100 MG PO TABS: 100.0000 mg | ORAL_TABLET | Freq: Every day | ORAL | 0 refills | Status: DC

## 2024-03-14 ENCOUNTER — Encounter (INDEPENDENT_AMBULATORY_CARE_PROVIDER_SITE_OTHER): Payer: Self-pay | Admitting: Vascular Surgery

## 2024-03-14 ENCOUNTER — Ambulatory Visit (INDEPENDENT_AMBULATORY_CARE_PROVIDER_SITE_OTHER): Admitting: Family Medicine

## 2024-03-14 ENCOUNTER — Ambulatory Visit (INDEPENDENT_AMBULATORY_CARE_PROVIDER_SITE_OTHER): Admitting: Vascular Surgery

## 2024-03-14 VITALS — BP 109/71 | HR 58 | Temp 97.7°F | Ht 68.0 in | Wt 162.0 lb

## 2024-03-14 VITALS — BP 107/74 | HR 67 | Resp 18 | Ht 68.0 in | Wt 162.8 lb

## 2024-03-14 DIAGNOSIS — R42 Dizziness and giddiness: Secondary | ICD-10-CM | POA: Diagnosis not present

## 2024-03-14 DIAGNOSIS — E663 Overweight: Secondary | ICD-10-CM

## 2024-03-14 DIAGNOSIS — Z6828 Body mass index (BMI) 28.0-28.9, adult: Secondary | ICD-10-CM

## 2024-03-14 DIAGNOSIS — I8393 Asymptomatic varicose veins of bilateral lower extremities: Secondary | ICD-10-CM

## 2024-03-14 NOTE — Progress Notes (Signed)
   Established Patient Office Visit  Subjective  Patient ID: Diana [name], female    DOB: 28-Aug-1991  Age: 33 y.o. MRN: [id number]  Chief Complaint  Patient presents with   Weight Management Screening   Diana [name] is a pleasant 33 year old female patient who presents today for weight loss management.  Currently on Zepbound  2.5mg  weekly-- now I can eat but feels full with a normal portion size  Side effects of being slightly lightheaded. Trying to stay hydrated.  Energy is better-- started taking OTC iron & vitamin B12  Original BMI 28.74 (189 lbs) Today's BMI 24.68 (162 lbs)  Goal weight is 150 lbs   ROS: see HPI     Objective:    BP 109/71   Pulse (!) 58   Temp 97.7 F (36.5 C) (Oral)   Ht 5' 8 (1.727 m)   Wt 162 lb (73.5 kg)   LMP 03/04/2024 (Exact Date)   BMI 24.63 kg/m  BP Readings from Last 3 Encounters:  03/14/24 109/71  02/15/24 110/73  02/15/24 122/78    Physical Exam Vitals reviewed.  Constitutional:      Appearance: Normal appearance.   Cardiovascular:     Rate and Rhythm: Normal rate and regular rhythm.     Pulses: Normal pulses.     Heart sounds: Normal heart sounds.  Pulmonary:     Effort: Pulmonary effort is normal.     Breath sounds: Normal breath sounds.   Neurological:     Mental Status: She is alert.   Psychiatric:        Mood and Affect: Mood normal.        Behavior: Behavior normal.      Assessment & Plan:   1. Overweight with body mass index (BMI) of 28 to 28.9 in adult (Primary) Patient presents today for weight loss follow-up. She has lost a total of 27lbs since starting on Zepbound  compound. Currently taking 2.5mg  weekly and has been tolerating this dose well. She is continuing to lose weight with minimal medication side effects. No refills needed at this time. Will follow-up in 4 weeks.   2. Orthostatic lightheadedness Vital signs stable with BP 109/71 and HR 58. No acute red flags present. Denies chest pain,  shortness of breath, lower extremity edema, vision changes, headaches. Cardiovascular exam with heart regular rate and rhythm. Normal heart sounds, no murmurs present. No lower extremity edema present. Discussed maintaining adequate hydration, consuming electrolyte supplements with sodium, and changing positions slowly. Discussed assessment of lab work- plan to complete at next visit.   Return in about 4 weeks (around 04/11/2024) for weight loss f/u .    Diana Hanson, FNP

## 2024-03-14 NOTE — Patient Instructions (Signed)

## 2024-03-25 ENCOUNTER — Encounter (INDEPENDENT_AMBULATORY_CARE_PROVIDER_SITE_OTHER): Payer: Self-pay | Admitting: Vascular Surgery

## 2024-03-25 NOTE — Progress Notes (Signed)
 Subjective:    Patient ID: Diana [name], female    DOB: [birth year], 33 y.o.   MRN: [id number] Chief Complaint  Patient presents with   Establish Care    New patient consult BLE VV Ref. Diana [name] is a 33 year old female who presents to clinic with chief complaint of bilateral lower extremity varicose veins that are painful behind her knees with intermittent leg swelling after exercise.  Patient states that after exercising intensely her feet can become swollen extending to her ankles.  She denies any pain but says it is uncomfortable.  She also has varicose veins that are painful behind her knees after standing for long periods of time or exercising intensely.  She is also very concerned that there is a family history of varicose veins that are not aesthetically pleasing and she wishes to try to prevent from having or obtaining these types of varicose veins.    Review of Systems  Constitutional: Negative.   Cardiovascular:  Positive for leg swelling.  Skin:        Positive bilateral lower extremity varicose veins behind her knee  All other systems reviewed and are negative.      Objective:   Physical Exam Vitals reviewed.  Constitutional:      Appearance: Normal appearance. She is normal weight.  HENT:     Head: Normocephalic.   Eyes:     Pupils: Pupils are equal, round, and reactive to light.    Cardiovascular:     Rate and Rhythm: Normal rate and regular rhythm.  Pulmonary:     Effort: Pulmonary effort is normal.     Breath sounds: Normal breath sounds.  Abdominal:     General: Abdomen is flat.     Palpations: Abdomen is soft.   Musculoskeletal:        General: Normal range of motion.     Cervical back: Normal range of motion.   Skin:    General: Skin is warm and dry.   Neurological:     General: No focal deficit present.     Mental Status: She is alert and oriented to person, place, and time. Mental status is at baseline.   Psychiatric:         Mood and Affect: Mood normal.        Behavior: Behavior normal.        Thought Content: Thought content normal.        Judgment: Judgment normal.     BP 107/74 (BP Location: Left Arm, Patient Position: Sitting, Cuff Size: Normal)   Pulse 67   Resp 18   Ht 5' 8 (1.727 m)   Wt 162 lb 12.8 oz (73.8 kg)   LMP 03/04/2024 (Exact Date)   BMI 24.75 kg/m   Past Medical History:  Diagnosis Date   Abnormal Pap smear of cervix    History of Papanicolaou smear of cervix 05/10/2015; [id number]   neg; neg   Immunization, viral disease    gardasil completed    Social History   Socioeconomic History   Marital status: Single    Spouse name: Not on file   Number of children: Not on file   Years of education: 14   Highest education level: Not on file  Occupational History   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  Tobacco Use   Smoking status: Never    Passive exposure: Never   Smokeless tobacco: Never  Vaping Use   Vaping status: Never Used  Substance and Sexual  Activity   Alcohol use: Yes    Comment: occ   Drug use: No   Sexual activity: Yes    Birth control/protection: Pill  Other Topics Concern   Not on file  Social History Narrative   Not on file   Social Drivers of Health   Financial Resource Strain: Low Risk  (01/27/2022)   Received [facility]   Overall Financial Resource Strain (CARDIA)    Difficulty of Paying Living Expenses: Not very hard  Food Insecurity: No Food Insecurity (09/12/2023)   Received from [facility]   Hunger Vital Sign    Within the past 12 months, you worried that your food would run out before you got the money to buy more.: Never true    Within the past 12 months, the food you bought just didn't last and you didn't have money to get more.: Never true  Transportation Needs: No Transportation Needs (09/12/2023)   Received from [facility]   PRAPARE - Transportation    Lack of Transportation (Medical): No    Lack of Transportation  (Non-Medical): No  Physical Activity: Not on file  Stress: Not on file  Social Connections: Not on file  Intimate Partner Violence: Not on file    Past Surgical History:  Procedure Laterality Date   AUGMENTATION MAMMAPLASTY     COLPOSCOPY  12/19/2010   lgsil    Family History  Problem Relation Age of Onset   Diabetes Mother        Type 2   Atrial fibrillation Father    Diabetes Maternal Grandmother    Atrial fibrillation Sister    Aneurysm Brother        brain - half brother   Cancer Paternal Grandfather        lung    No Known Allergies     Latest Ref Rng & Units 02/18/2020    3:50 PM 05/18/2019    6:26 AM 05/17/2019    9:27 AM  CBC  WBC 3.4 - 10.8 x10E3/uL 8.3  10.3  8.7   Hemoglobin 11.1 - 15.9 g/dL 87.2  89.1  89.1   Hematocrit 34.0 - 46.6 % 39.4  33.3  32.1   Platelets 150 - 450 x10E3/uL 238  167  191       CMP     Component Value Date/Time   NA 137 02/18/2020 1550   K 4.0 02/18/2020 1550   CL 100 02/18/2020 1550   CO2 23 02/18/2020 1550   GLUCOSE 95 02/18/2020 1550   BUN 16 02/18/2020 1550   CREATININE 0.78 02/18/2020 1550   CALCIUM 9.8 02/18/2020 1550   PROT 7.2 02/18/2020 1550   ALBUMIN 4.3 02/18/2020 1550   AST 16 02/18/2020 1550   ALT 14 02/18/2020 1550   ALKPHOS 80 02/18/2020 1550   BILITOT 0.3 02/18/2020 1550   GFRNONAA 102 02/18/2020 1550     No results found.     Assessment & Plan:   1. Asymptomatic varicose veins of both lower extremities (Primary) Patient presents to clinic today with chief concern of varicose veins that are intermittently painful with intermittent swelling to her bilateral lower extremities.  Patient states that she does exercise multiple times a week and feels like the symptoms are worse after exercising.  Today on exam she does not have any bilateral lower extremity swelling of the varicose veins behind her knees are barely visible.  Her biggest concern is varicose veins that her mother has and she does  not want to  obtain those.  We did discuss in detail the use of compression to help alleviate some of her symptoms as well as the use of the daily aspirin.  I recommended that the patient continue to exercise as she is she should invest in compression stockings or hose that will come up over her knees to her thighs to protect the back of her knees where the varicose veins are bothering her.  I also recommended that when she sits she elevates her legs to rest her legs and it would be vitally important to do this if she was developing any swelling.  I did encourage some exercise but not at the level of this when she was doing it.   I recommend that she undergo venous bilateral lower extremity ultrasounds to rule out DVTs and reflux for better evaluation of her varicose veins.  She can follow-up with me afterwards for the results and future plan.  Patient agrees with the plan.   Current Outpatient Medications on File Prior to Visit  Medication Sig Dispense Refill   escitalopram (LEXAPRO) 10 MG tablet Take 10 mg by mouth daily.     etonogestrel -ethinyl estradiol  (NUVARING) 0.12-0.015 MG/24HR vaginal ring Insert vaginally and leave in place for 3 consecutive weeks, then remove for 1 week. 1 each 12   fluconazole  (DIFLUCAN ) 100 MG tablet Take 1 tablet (100 mg total) by mouth daily. 1 tablet 0   ibuprofen  (ADVIL ) 600 MG tablet Take by mouth.     metroNIDAZOLE  (METROGEL ) 1 % gel Apply topically daily. 60 g 0   Multiple Vitamin (MULTI-VITAMIN) tablet Take 1 tablet by mouth daily.     tirzepatide  (ZEPBOUND ) 2.5 MG/0.5ML injection vial Inject 2.5 mg into the skin once a week. 2.125 mL 1   No current facility-administered medications on file prior to visit.    There are no Patient Instructions on file for this visit. No follow-ups on file.   Gwendlyn JONELLE Shank, NP

## 2024-04-18 ENCOUNTER — Encounter: Payer: Self-pay | Admitting: Family Medicine

## 2024-04-18 ENCOUNTER — Ambulatory Visit (INDEPENDENT_AMBULATORY_CARE_PROVIDER_SITE_OTHER): Admitting: Family Medicine

## 2024-04-18 VITALS — BP 104/68 | HR 80 | Resp 16 | Ht 68.0 in | Wt 158.0 lb

## 2024-04-18 DIAGNOSIS — E669 Obesity, unspecified: Secondary | ICD-10-CM

## 2024-04-18 DIAGNOSIS — Z6824 Body mass index (BMI) 24.0-24.9, adult: Secondary | ICD-10-CM | POA: Diagnosis not present

## 2024-04-18 NOTE — Patient Instructions (Signed)

## 2024-04-18 NOTE — Progress Notes (Signed)
   Established Patient Office Visit  Subjective  Patient ID: Diana [name], female    DOB: 02-16-1991  Age: 33 y.o. MRN: [id number]  Chief Complaint  Patient presents with   Obesity   WEIGHT MANAGEMENT: Diana [name] is a pleasant 33 year old female patient who presents for weight management.  Adhering to healthy diet: yes, appetite has increased but not able to eat as much  Regular exercise regimen: not consistent  Current meds: Zepbound  compound 2.5mg  weekly  Wt Readings from Last 3 Encounters:  04/18/24 158 lb (71.7 kg)  03/14/24 162 lb 12.8 oz (73.8 kg)  03/14/24 162 lb (73.5 kg)   ROS: see HPI     Objective:    BP 104/68   Pulse 80   Resp 16   Ht 5' 8 (1.727 m)   Wt 158 lb (71.7 kg)   LMP 04/04/2024   SpO2 99%   BMI 24.02 kg/m  BP Readings from Last 3 Encounters:  04/18/24 104/68  03/14/24 107/74  03/14/24 109/71    Physical Exam Vitals reviewed.  Constitutional:      Appearance: Normal appearance.  Cardiovascular:     Rate and Rhythm: Normal rate and regular rhythm.     Pulses: Normal pulses.     Heart sounds: Normal heart sounds.  Pulmonary:     Effort: Pulmonary effort is normal.     Breath sounds: Normal breath sounds.  Neurological:     Mental Status: She is alert.  Psychiatric:        Mood and Affect: Mood normal.        Behavior: Behavior normal.     Assessment & Plan:   1. Body mass index (BMI) of 24.0 to 24.9 in adult (Primary) Patient presents today for weight loss follow-up. Her original weight was 189lbs and today she is 158lbs- has lost a total of 31lbs. Her goal weight is around 150lbs. She plans to focus more on lifestyle modifications- including healthy diet and daily exericse. Currently taking 2.5mg  Zepbound  weekly and has been tolerating this dose well. She is continuing to lose weight with minimal medication side effects and would like to stay at this current dose. No refills needed at this time. [name] follow-up in 3 months.  Advised patient to reach out if she needs to follow-up sooner if she notices her weight decreases below 150lbs.   Return in about 3 months (around 07/19/2024) for Physical with fasting labs.    Evalene Arts, FNP

## 2024-05-23 ENCOUNTER — Encounter (INDEPENDENT_AMBULATORY_CARE_PROVIDER_SITE_OTHER)

## 2024-05-23 ENCOUNTER — Ambulatory Visit (INDEPENDENT_AMBULATORY_CARE_PROVIDER_SITE_OTHER): Admitting: Nurse Practitioner

## 2024-06-03 ENCOUNTER — Other Ambulatory Visit (INDEPENDENT_AMBULATORY_CARE_PROVIDER_SITE_OTHER): Payer: Self-pay | Admitting: Vascular Surgery

## 2024-06-03 DIAGNOSIS — I83813 Varicose veins of bilateral lower extremities with pain: Secondary | ICD-10-CM

## 2024-06-06 ENCOUNTER — Encounter (INDEPENDENT_AMBULATORY_CARE_PROVIDER_SITE_OTHER)

## 2024-06-06 ENCOUNTER — Ambulatory Visit (INDEPENDENT_AMBULATORY_CARE_PROVIDER_SITE_OTHER): Admitting: Nurse Practitioner

## 2024-06-12 ENCOUNTER — Other Ambulatory Visit: Payer: Self-pay | Admitting: Family Medicine

## 2024-06-12 DIAGNOSIS — Z6828 Body mass index (BMI) 28.0-28.9, adult: Secondary | ICD-10-CM

## 2024-06-12 MED ORDER — TIRZEPATIDE-WEIGHT MANAGEMENT 2.5 MG/0.5ML ~~LOC~~ SOLN: 2.5000 mg | SUBCUTANEOUS | 1 refills | Status: DC

## 2024-06-12 NOTE — Telephone Encounter (Signed)
 Copied from CRM [id number]. Topic: Clinical - Medication Refill >> Jun 12, 2024  2:59 PM Delon T wrote: Medication: tirzepatide  (ZEPBOUND ) 2.5 MG/0.5ML injection vial  Has the patient contacted their pharmacy? Yes (Agent: If no, request that the patient contact the pharmacy for the refill. If patient does not wish to contact the pharmacy document the reason why and proceed with request.) (Agent: If yes, when and what did the pharmacy advise?)  This is the patient's preferred pharmacy:    [facility], [place] - [address] [place] [address] Phone: [phone] Fax: [phone]  Is this the correct pharmacy for this prescription? Yes If no, delete pharmacy and type the correct one.   Has the prescription been filled recently? Yes  Is the patient out of the medication? Yes  Has the patient been seen for an appointment in the last year OR does the patient have an upcoming appointment? Yes  Can we respond through MyChart? Yes  Agent: Please be advised that Rx refills may take up to 3 business days. We ask that you follow-up with your pharmacy.

## 2024-07-04 ENCOUNTER — Ambulatory Visit (INDEPENDENT_AMBULATORY_CARE_PROVIDER_SITE_OTHER): Admitting: Nurse Practitioner

## 2024-07-04 ENCOUNTER — Encounter (INDEPENDENT_AMBULATORY_CARE_PROVIDER_SITE_OTHER)

## 2024-07-11 ENCOUNTER — Ambulatory Visit

## 2024-07-11 DIAGNOSIS — E663 Overweight: Secondary | ICD-10-CM

## 2024-07-11 DIAGNOSIS — R42 Dizziness and giddiness: Secondary | ICD-10-CM

## 2024-07-11 DIAGNOSIS — Z136 Encounter for screening for cardiovascular disorders: Secondary | ICD-10-CM

## 2024-07-11 DIAGNOSIS — F419 Anxiety disorder, unspecified: Secondary | ICD-10-CM

## 2024-07-12 LAB — COMPREHENSIVE METABOLIC PANEL WITH GFR
ALT: 13 IU/L (ref 0–32)
AST: 16 IU/L (ref 0–40)
Albumin: 4.3 g/dL (ref 3.9–4.9)
Alkaline Phosphatase: 78 IU/L (ref 41–116)
BUN/Creatinine Ratio: 19 (ref 9–23)
BUN: 13 mg/dL (ref 6–20)
Bilirubin Total: 0.5 mg/dL (ref 0.0–1.2)
CO2: 21 mmol/L (ref 20–29)
Calcium: 9.2 mg/dL (ref 8.7–10.2)
Chloride: 104 mmol/L (ref 96–106)
Creatinine, Ser: 0.69 mg/dL (ref 0.57–1.00)
Globulin, Total: 2.7 g/dL (ref 1.5–4.5)
Glucose: 91 mg/dL (ref 70–99)
Potassium: 4.1 mmol/L (ref 3.5–5.2)
Sodium: 139 mmol/L (ref 134–144)
Total Protein: 7 g/dL (ref 6.0–8.5)
eGFR: 116 mL/min/1.73 (ref >59)

## 2024-07-12 LAB — LIPID PANEL
Chol/HDL Ratio: 3.2 ratio (ref 0.0–4.4)
Cholesterol, Total: 159 mg/dL (ref 100–199)
HDL: 49 mg/dL (ref >39)
LDL Chol Calc (NIH): 96 mg/dL (ref 0–99)
Triglycerides: 72 mg/dL (ref 0–149)
VLDL Cholesterol Cal: 14 mg/dL (ref 5–40)

## 2024-07-12 LAB — CBC WITH DIFFERENTIAL/PLATELET
Basophils Absolute: 0 x10E3/uL (ref 0.0–0.2)
Basos: 1 %
EOS (ABSOLUTE): 0.1 x10E3/uL (ref 0.0–0.4)
Eos: 2 %
Hematocrit: 37 % (ref 34.0–46.6)
Hemoglobin: 12.1 g/dL (ref 11.1–15.9)
Immature Grans (Abs): 0 x10E3/uL (ref 0.0–0.1)
Immature Granulocytes: 0 %
Lymphocytes Absolute: 2 x10E3/uL (ref 0.7–3.1)
Lymphs: 39 %
MCH: 31 pg (ref 26.6–33.0)
MCHC: 32.7 g/dL (ref 31.5–35.7)
MCV: 95 fL (ref 79–97)
Monocytes Absolute: 0.2 x10E3/uL (ref 0.1–0.9)
Monocytes: 5 %
Neutrophils Absolute: 2.8 x10E3/uL (ref 1.4–7.0)
Neutrophils: 53 %
Platelets: 224 x10E3/uL (ref 150–450)
RBC: 3.9 x10E6/uL (ref 3.77–5.28)
RDW: 11.5 % — ABNORMAL LOW (ref 11.7–15.4)
WBC: 5.2 x10E3/uL (ref 3.4–10.8)

## 2024-07-12 LAB — HEMOGLOBIN A1C
Est. average glucose Bld gHb Est-mCnc: 105 mg/dL
Hgb A1c MFr Bld: 5.3 % (ref 4.8–5.6)

## 2024-07-12 LAB — TSH: TSH: 1.24 u[IU]/mL (ref 0.450–4.500)

## 2024-07-17 ENCOUNTER — Ambulatory Visit: Payer: Self-pay | Admitting: Family Medicine

## 2024-07-18 ENCOUNTER — Encounter: Payer: Self-pay | Admitting: Family Medicine

## 2024-07-18 ENCOUNTER — Ambulatory Visit (INDEPENDENT_AMBULATORY_CARE_PROVIDER_SITE_OTHER): Admitting: Family Medicine

## 2024-07-18 VITALS — BP 113/77 | HR 77 | Resp 16 | Ht 68.0 in | Wt 154.8 lb

## 2024-07-18 DIAGNOSIS — E663 Overweight: Secondary | ICD-10-CM | POA: Diagnosis not present

## 2024-07-18 DIAGNOSIS — Z6828 Body mass index (BMI) 28.0-28.9, adult: Secondary | ICD-10-CM

## 2024-07-18 DIAGNOSIS — Z0000 Encounter for general adult medical examination without abnormal findings: Secondary | ICD-10-CM | POA: Diagnosis not present

## 2024-07-18 MED ORDER — TIRZEPATIDE-WEIGHT MANAGEMENT 2.5 MG/0.5ML ~~LOC~~ SOLN: 2.5000 mg | SUBCUTANEOUS | 1 refills | Status: AC

## 2024-07-18 NOTE — Progress Notes (Signed)
 Subjective:   Diana [name] [birth year]  07/18/2024   CC: Annual Physical Exam   HPI: Diana [name] is a 33 y.o. female who presents for a routine health maintenance exam. Fasting labs collected prior to visit.   HEALTH SCREENINGS: - Vision Screening: plans to schedule  - Dental Visits: up to date - Pap smear: up to date - Breast Exam: up to date - STD Screening: Declined - Mammogram (40+): Not applicable  - Colonoscopy (45+): Not applicable  - Bone Density (65+ or under 65 with predisposing conditions): Not applicable  - Lung CA screening with low-dose CT:  Not applicable Adults age 67-80 who are current cigarette smokers or quit within the last 15 years. Must have 20 pack year history.   Depression and Anxiety Screen done today and results listed below:     07/18/2024    8:55 AM 04/18/2024    8:37 AM 03/14/2024    9:03 AM 02/15/2024    9:16 AM 12/07/2023    9:20 AM  Depression screen PHQ 2/9  Decreased Interest 0 0 0 0 0  Down, Depressed, Hopeless 0 0 0 0 0  PHQ - 2 Score 0 0 0 0 0  Altered sleeping 0 0 0 0 0  Tired, decreased energy 2 1 2 2 1   Change in appetite 0 0 0 0 1  Feeling bad or failure about yourself  0 0 0 0 0  Trouble concentrating 0 0 0 0 0  Moving slowly or fidgety/restless 0 0 0 0 0  Suicidal thoughts 0 0 0 0 0  PHQ-9 Score 2 1 2 2 2   Difficult doing work/chores Somewhat difficult Not difficult at all Somewhat difficult Somewhat difficult Somewhat difficult      07/18/2024    8:55 AM 04/18/2024    8:39 AM 03/14/2024    9:04 AM 12/07/2023    9:20 AM  GAD 7 : Generalized Anxiety Score  Nervous, Anxious, on Edge 0 0 2 0  Control/stop worrying 1 0 2 0  Worry too much - different things 1 0 2 0  Trouble relaxing 1 0 2 2  Restless 0 0 0 1  Easily annoyed or irritable 1 0 1 1  Afraid - awful might happen 1 0 1 1  Total GAD 7 Score 5 0 10 5  Anxiety Difficulty Not difficult at all Not difficult at all Not difficult at all Somewhat difficult    IMMUNIZATIONS: - Tdap: Tetanus vaccination status reviewed: last tetanus booster within 10 years. - HPV: Up to date - Influenza: Declined  - Pneumovax: Not applicable - Prevnar 20: Not applicable - Zostavax (50+): Not applicable  Past medical history, surgical history, medications, allergies, family history and social history reviewed with patient today and changes made to appropriate areas of the chart.   Past Medical History:  Diagnosis Date   Abnormal Pap smear of cervix    History of Papanicolaou smear of cervix 05/10/2015; [id number]   neg; neg   Immunization, viral disease    gardasil completed    Past Surgical History:  Procedure Laterality Date   AUGMENTATION MAMMAPLASTY     COLPOSCOPY  12/19/2010   lgsil    Current Outpatient Medications on File Prior to Visit  Medication Sig   escitalopram (LEXAPRO) 10 MG tablet Take 10 mg by mouth daily.   etonogestrel -ethinyl estradiol  (NUVARING) 0.12-0.015 MG/24HR vaginal ring Insert vaginally and leave in place for 3 consecutive weeks, then remove for 1 week.  ibuprofen  (ADVIL ) 600 MG tablet Take by mouth.   Multiple Vitamin (MULTI-VITAMIN) tablet Take 1 tablet by mouth daily.   No current facility-administered medications on file prior to visit.   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  Tobacco Use   Smoking status: Never    Passive exposure: Never   Smokeless tobacco: Never  Vaping Use   Vaping status: Never Used  Substance and Sexual Activity   Alcohol use: Yes    Comment: occ   Drug use: No   Sexual activity: Yes  Other Topics Concern   Not on file  Social History Narrative   Not on file   Social Drivers of Health   Financial Resource Strain: Low Risk  (01/27/2022)   Received from  [facility]   Overall Financial Resource Strain (CARDIA)    Difficulty of Paying Living Expenses: Not very hard  Food Insecurity: No Food Insecurity (09/12/2023)   Received from [facility]   Hunger Vital Sign    Within the past 12 months, you worried that your food would run out before you got the money to buy more.: Never true    Within the past 12 months, the food you bought just didn't last and you didn't have money to get more.: Never true  Transportation Needs: No Transportation Needs (09/12/2023)   Received from [facility]   PRAPARE - Transportation    Lack of Transportation (Medical): No    Lack of Transportation (Non-Medical): No  Physical Activity: Not on file  Stress: Not on file  Social Connections: Not on file  Intimate Partner Violence: Not on file   Social History   Tobacco Use  Smoking Status Never   Passive exposure: Never  Smokeless Tobacco Never   Social History   Substance and Sexual Activity  Alcohol Use Yes   Comment: occ    Family History  Problem Relation Age of Onset   Diabetes Mother        Type 2   Atrial fibrillation Father    Diabetes Maternal Grandmother    Atrial fibrillation Sister    Aneurysm Brother        brain - half brother   Cancer Paternal Grandfather        lung   ROS: Denies fever, fatigue, unexplained weight loss/gain, chest pain, SHOB, and palpitations. Denies neurological deficits, gastrointestinal or genitourinary complaints, and skin changes.   Objective:   Today's Vitals   07/18/24 0846  BP: 113/77  Pulse: 77  Resp: 16  SpO2: 99%  Weight: 154 lb 12.8 oz (70.2 kg)  Height: 5' 8 (1.727 m)  PainSc: 0-No pain   GENERAL APPEARANCE: Well-appearing, in NAD. Well nourished.  SKIN: Pink, warm and dry. Turgor normal. No rash, lesion, ulceration, or ecchymoses. Hair evenly distributed.  HEENT: HEAD: Normocephalic.  EYES: PERRLA. EOMI. Lids intact w/o defect. Sclera white, Conjunctiva pink w/o exudate.   EARS: External ear w/o redness, swelling, masses or lesions. EAC clear. TM's intact, translucent w/o bulging, appropriate landmarks visualized. Appropriate acuity to conversational tones.  NOSE: Septum midline w/o deformity. Nares patent, mucosa pink and non-inflamed w/o drainage. No sinus tenderness.  THROAT: Uvula midline. Oropharynx clear. Tonsils non-inflamed w/o exudate. Oral mucosa  pink and moist.  NECK: Supple, Trachea midline. Full ROM w/o pain or tenderness. No lymphadenopathy. Thyroid non-tender w/o enlargement or palpable masses.  BREASTS: Deferred.  RESPIRATORY: Chest wall symmetrical w/o masses. Respirations even and non-labored. Breath sounds clear to auscultation bilaterally. No wheezes, rales, rhonchi, or crackles. CARDIAC: S1, S2 present, regular rate and rhythm. No gallops, murmurs, rubs, or clicks. PMI w/o lifts, heaves, or thrills. No carotid bruits. Capillary refill <2 seconds. Peripheral pulses 2+ bilaterally. GI: Abdomen soft w/o distention. Normoactive bowel sounds. No palpable masses or tenderness. No guarding or rebound tenderness. Liver and spleen w/o tenderness or enlargement. No CVA tenderness.  GU: Deferred.  MSK: Muscle tone and strength appropriate for age, w/o atrophy or abnormal movement.  EXTREMITIES: Active ROM intact, w/o tenderness, crepitus, or contracture. No obvious joint deformities or effusions. No clubbing, edema, or cyanosis.  NEUROLOGIC: CN's II-XII intact. Motor strength symmetrical with no obvious weakness. No sensory deficits. DTR's 2+ symmetric bilaterally. Steady, even gait.  PSYCH/MENTAL STATUS: Alert, oriented x 3. Cooperative, appropriate mood and affect.   Results for orders placed or performed in visit on 07/11/24  Comprehensive metabolic panel with GFR   Collection Time: 07/11/24  8:44 AM  Result Value Ref Range   Glucose 91 70 - 99 mg/dL   BUN 13 6 - 20 mg/dL   Creatinine, Ser 9.30 0.57 - 1.00 mg/dL   eGFR 883 >40 fO/fpw/8.26    BUN/Creatinine Ratio 19 9 - 23   Sodium 139 134 - 144 mmol/L   Potassium 4.1 3.5 - 5.2 mmol/L   Chloride 104 96 - 106 mmol/L   CO2 21 20 - 29 mmol/L   Calcium 9.2 8.7 - 10.2 mg/dL   Total Protein 7.0 6.0 - 8.5 g/dL   Albumin 4.3 3.9 - 4.9 g/dL   Globulin, Total 2.7 1.5 - 4.5 g/dL   Bilirubin Total 0.5 0.0 - 1.2 mg/dL   Alkaline Phosphatase 78 41 - 116 IU/L   AST 16 0 - 40 IU/L   ALT 13 0 - 32 IU/L  TSH   Collection Time: 07/11/24  8:44 AM  Result Value Ref Range   TSH 1.240 0.450 - 4.500 uIU/mL  Lipid panel   Collection Time: 07/11/24  8:44 AM  Result Value Ref Range   Cholesterol, Total 159 100 - 199 mg/dL   Triglycerides 72 0 - 149 mg/dL   HDL 49 >60 mg/dL   VLDL Cholesterol Cal 14 5 - 40 mg/dL   LDL Chol Calc (NIH) 96 0 - 99 mg/dL   Chol/HDL Ratio 3.2 0.0 - 4.4 ratio  Hemoglobin A1c   Collection Time: 07/11/24  8:44 AM  Result Value Ref Range   Hgb A1c MFr Bld 5.3 4.8 - 5.6 %   Est. average glucose Bld gHb Est-mCnc 105 mg/dL  CBC with Differential/Platelet   Collection Time: 07/11/24  8:44 AM  Result Value Ref Range   WBC 5.2 3.4 - 10.8 x10E3/uL   RBC 3.90 3.77 - 5.28 x10E6/uL   Hemoglobin 12.1 11.1 - 15.9 g/dL   Hematocrit 62.9 65.9 - 46.6 %   MCV 95 79 - 97 fL   MCH 31.0 26.6 - 33.0 pg   MCHC 32.7 31.5 - 35.7 g/dL   RDW 88.4 (L) 88.2 - 84.5 %   Platelets 224 150 - 450 x10E3/uL   Neutrophils 53 Not Estab. %   Lymphs 39 Not Estab. %   Monocytes 5 Not Estab. %   Eos 2 Not Estab. %  Basos 1 Not Estab. %   Neutrophils Absolute 2.8 1.4 - 7.0 x10E3/uL   Lymphocytes Absolute 2.0 0.7 - 3.1 x10E3/uL   Monocytes Absolute 0.2 0.1 - 0.9 x10E3/uL   EOS (ABSOLUTE) 0.1 0.0 - 0.4 x10E3/uL   Basophils Absolute 0.0 0.0 - 0.2 x10E3/uL   Immature Granulocytes 0 Not Estab. %   Immature Grans (Abs) 0.0 0.0 - 0.1 x10E3/uL    Assessment & Plan:   1. Wellness examination (Primary) - Encouraged a healthy well-balanced diet. Patient may adjust caloric intake to maintain or  achieve ideal body weight. May reduce intake of dietary saturated fat and total fat and have adequate dietary potassium and calcium preferably from fresh fruits, vegetables, and low-fat dairy products.   - Advised to avoid cigarette smoking. - Discussed with the patient that most people either abstain from alcohol or drink within safe limits (<=14/week and <=4 drinks/occasion for males, <=7/weeks and <= 3 drinks/occasion for females) and that the risk for alcohol disorders and other health effects rises proportionally with the number of drinks per week and how often a drinker exceeds daily limits. - Discussed cessation/primary prevention of drug use and availability of treatment for abuse.  - Discussed sexually transmitted diseases, avoidance of unintended pregnancy and contraceptive alternatives. - Stressed the importance of regular exercise - Injury prevention: Discussed safety belts, safety helmets, smoke detector, smoking near bedding or upholstery.  - Dental health: Discussed importance of regular tooth brushing, flossing, and dental visits.  NEXT PREVENTATIVE PHYSICAL DUE IN 1 YEAR.  Return in about 6 months (around 01/16/2025) for Mood f/u.  Patient to reach out to office if new, worrisome, or unresolved symptoms arise or if no improvement in patient's condition. Patient verbalized understanding and is agreeable to treatment plan. All questions answered to patient's satisfaction.   Evalene Arts, FNP

## 2024-07-18 NOTE — Patient Instructions (Signed)
 Health Maintenance Recommendations Screening Testing Mammogram Every 1 -2 years based on history and risk factors Starting at age 35 Please call to schedule: [facility]  [address], [place], [place] [address] Phone: [phone] [facility] [address], [place], [place] [address] Phone: [phone] Pap Smear Ages 21-39 every 3 years Ages 32-65 every 5 years with HPV testing More frequent testing may be required based on results and history Colon Cancer Screening Every 1-10 years based on test performed, risk factors, and history Starting at age 47 Bone Density Screening Every 2-10 years based on history Starting at age 34 for women Recommendations for men differ based on medication usage, history, and risk factors AAA Screening One time ultrasound Men 44-26 years old who have every smoked Lung Cancer Screening Low Dose Lung CT every 12 months Age 14-80 years with a 30 pack-year smoking history who still smoke or who have quit within the last 15 years   Screening Labs Routine  Labs: Complete Blood Count (CBC), Complete Metabolic Panel (CMP), Cholesterol (Lipid Panel) Every 6-12 months based on history and medications May be recommended more frequently based on current conditions or previous results Hemoglobin A1c Lab Every 3-12 months based on history and previous results Starting at age 82 or earlier with diagnosis of diabetes, high cholesterol, BMI >26, and/or risk factors Frequent monitoring for patients with diabetes to ensure blood sugar control Thyroid  Panel (TSH w/ T3 & T4) Every 6 months based on history, symptoms, and risk factors May be repeated more often if on medication HIV One time testing for all patients 76 and older May be repeated more frequently for patients with increased risk factors or exposure Hepatitis C One time testing for all patients 13 and older May be repeated more frequently for patients with increased risk factors or  exposure Gonorrhea, Chlamydia Every 12 months for all sexually active persons 13-24 years Additional monitoring may be recommended for those who are considered high risk or who have symptoms PSA Men 100-42 years old with risk factors Additional screening may be recommended from age 3-69 based on risk factors, symptoms, and history   Vaccine Recommendations Tetanus Booster All adults every 10 years Flu Vaccine All patients 6 months and older every year COVID Vaccine All patients 12 years and older Initial dosing with booster May recommend additional booster based on age and health history HPV Vaccine 2 doses all patients age 53-26 Dosing may be considered for patients over 26 Shingles Vaccine (Shingrix) 2 doses all adults 55 years and older Pneumonia (Pneumovax 87) All adults 65 years and older May recommend earlier dosing based on health history Pneumonia (Prevnar 47) All adults 65 years and older Dosed 1 year after Pneumovax 23   Additional Screening, Testing, and Vaccinations may be recommended on an individualized basis based on family history, health history, risk factors, and/or exposure.  __________________________________________________________   Diet Recommendations for All Patients   I recommend that all patients maintain a diet low in saturated fats, carbohydrates, and cholesterol. While this can be challenging at first, it is not impossible and small changes can make big differences.  Things to try: Decreasing the amount of soda, sweet tea, and/or juice to one or less per day and replace with water  While water  is always the first choice, if you do not like water  you may consider adding a water  additive without sugar to improve the taste other sugar free drinks Replace potatoes with a brightly colored vegetable at dinner  Use healthy oils, such as canola oil or olive oil, instead of butter or hard margarine Limit your bread intake to two pieces or less a  day Replace regular pasta with low carb pasta options Bake, broil, or grill foods instead of frying Monitor portion sizes  Eat smaller, more frequent meals throughout the day instead of large meals   An important thing to remember is, if you love foods that are not great for your health, you don't have to give them up completely. Instead, allow these foods to be a reward when you have done well. Allowing yourself to still have special treats every once in a while is a nice way to tell yourself thank you for working hard to keep yourself healthy.    Also remember that every day is a new day. If you have a bad day and fall off the wagon, you can still climb right back up and keep moving along on your journey!   We have resources available to help you!  Some websites that may be helpful include: www.http://www.wall-moore.info/        Www.VeryWellFit.com _____________________________________________________________   Activity Recommendations for All Patients   I recommend that all adults get at least 20 minutes of moderate physical activity that elevates your heart rate at least 5 days out of the week.  Some examples include: Walking or jogging at a pace that allows you to carry on a conversation Cycling (stationary bike or outdoors) Water  aerobics Yoga Weight lifting Dancing If physical limitations prevent you from putting stress on your joints, exercise in a pool or seated in a chair are excellent options.   Do determine your MAXIMUM heart rate for activity: YOUR AGE - 220 = MAX HeartRate    Remember! Do not push yourself too hard.  Start slowly and build up your pace, speed, weight, time in exercise, etc.  Allow your body to rest between exercise and get good sleep. You will need more water  than normal when you are exerting yourself. Do not wait until you are thirsty to drink. Drink with a purpose of getting in at least 8, 8 ounce glasses of water  a day plus more depending on how much you exercise  and sweat.      If you begin to develop dizziness, chest pain, abdominal pain, jaw pain, shortness of breath, headache, vision changes, lightheadedness, or other concerning symptoms, stop the activity and allow your body to rest. If your symptoms are severe, seek emergency evaluation immediately. If your symptoms are concerning, but not severe, please let us  know so that we can recommend further evaluation.

## 2024-09-04 ENCOUNTER — Encounter: Payer: Self-pay | Admitting: Advanced Practice Midwife

## 2024-09-05 ENCOUNTER — Other Ambulatory Visit: Payer: Self-pay | Admitting: Licensed Practical Nurse

## 2024-09-05 DIAGNOSIS — R102 Pelvic and perineal pain unspecified side: Secondary | ICD-10-CM

## 2024-09-05 NOTE — Progress Notes (Signed)
 Pt reporting more pain, wonders if she has another cyst, pelvic US  ordered  Jinnie Cookey, CNM  [facility] 09/05/2024  12:40 PM

## 2024-09-09 ENCOUNTER — Encounter: Payer: Self-pay | Admitting: Family Medicine

## 2024-09-12 ENCOUNTER — Ambulatory Visit
Admission: RE | Admit: 2024-09-12 | Discharge: 2024-09-12 | Disposition: A | Discharge status: Home / Self Care | Payer: Self-pay | Source: Ambulatory Visit | Attending: Licensed Practical Nurse | Admitting: Licensed Practical Nurse

## 2024-09-12 DIAGNOSIS — R102 Pelvic and perineal pain unspecified side: Secondary | ICD-10-CM | POA: Insufficient documentation

## 2024-09-12 NOTE — Progress Notes (Signed)
 "   Towana Small, FNP   Chief Complaint  Patient presents with   Follow-up    HPI:      Diana [name] is a 33 y.o. [id number] whose LMP was Patient's last menstrual period was 08/18/2024 (approximate)., presents today for pelvic pain  1 year ago had an ovarian cycst-pain with IC was only symtotims, Pain started 2 to 3 months ago the pain got worse, then started to have dull aches in her hip-this pain is always worse when on her feet a lot, thi [place] is more frequent over the last 2-3 months, but ahs always bene there  Can go months with no pain with IC  then the pain will return Seen at [facility] 10/25 followed up with Dr Janit   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 , does pilates-started early those last year tires to 3-4 per week, a time [facility] to [facility] to be fited for shoes which she weas  at work,   Not on hormones cycles are heavier changes pad every 2 hours and more painful last up to  7 days Does have constipation   Motrin  600mg  as needed for hip pain  Has not felt the need to take pain for the cyst,   Started NUVa ring in April, cycles are monthly lasting 5 days not heavy, barley any cramping  Has pain with IC almost every time, on the right side during penetration depending on positions, the pain is sharp, the pain goes away  after IC   Patient Active Problem List   Diagnosis Date Noted   Orthostatic lightheadedness 03/14/2024   Asymptomatic varicose veins of both lower extremities 02/15/2024   Overweight with body mass index (BMI) of 28 to 28.9 in adult 12/13/2023   Cyst of right ovary 12/13/2023   Anxiety 06/03/2021    Past Surgical History:  Procedure Laterality Date   AUGMENTATION MAMMAPLASTY     COLPOSCOPY  12/19/2010   lgsil    Family History  Problem Relation Age of Onset   Diabetes Mother        Type 2   Atrial fibrillation Father    Diabetes Maternal Grandmother    Atrial fibrillation Sister    Aneurysm Brother        brain -  half brother   Cancer Paternal Grandfather        lung    Social History   Socioeconomic History   Marital status: Single    Spouse name: Not on file   Number of children: Not on file   Years of education: 14   Highest education level: Not on file  Occupational History   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IR STYLIST  Tobacco Use   Smoking status: Never    Passive exposure: Never   Smokeless tobacco: Never  Vaping Use   Vaping status: Never Used  Substance and Sexual Activity   Alcohol use: Yes    Comment: occ   Drug use: No   Sexual activity: Yes  Other Topics Concern   Not on file  Social History Narrative   Not on file   Social Drivers of Health   Tobacco Use: Low Risk (07/18/2024)   Patient History    Smoking Tobacco Use: Never    Smokeless Tobacco Use: Never    Passive Exposure: Never  Financial Resource Strain: Low Risk (01/27/2022)   Received from [facility]   Overall Financial Resource Strain (CARDIA)    Difficulty of Paying Living Expenses:  Not very hard  Food Insecurity: No Food Insecurity (09/12/2023)   Received from [facility]   Epic    Within the past 12 months, you worried that your food would run out before you got the money to buy more.: Never true    Within the past 12 months, the food you bought just didn't last and you didn't have money to get more.: Never true  Transportation Needs: No Transportation Needs (09/12/2023)   Received from [facility] - Transportation    Lack of Transportation (Medical): No    Lack of Transportation (Non-Medical): No  Physical Activity: Not on file  Stress: Not on file  Social Connections: Not on file  Intimate Partner Violence: Not on file  Depression (PHQ2-9): Low Risk (07/18/2024)   Depression (PHQ2-9)    PHQ-2 Score: 2  Alcohol Screen: Not on file  Housing: Not on file  Utilities: Low Risk (09/12/2023)   Received from [facility]   Utilities    Within the past 12 months, have you been unable to  get utilities(heat, electricity) when it was really needed?: No  Health Literacy: Not on file    Outpatient Medications Prior to Visit  Medication Sig Dispense Refill   etonogestrel -ethinyl estradiol  (NUVARING) 0.12-0.015 MG/24HR vaginal ring Insert vaginally and leave in place for 3 consecutive weeks, then remove for 1 week. 1 each 12   ibuprofen  (ADVIL ) 600 MG tablet Take by mouth.     Multiple Vitamin (MULTI-VITAMIN) tablet Take 1 tablet by mouth daily.     tirzepatide  (ZEPBOUND ) 2.5 MG/0.5ML injection vial Inject 2.5 mg into the skin once a week. 2.125 mL 1   triamcinolone ointment (KENALOG) 0.1 % Apply topically 2 (two) times daily.     escitalopram  (LEXAPRO ) 10 MG tablet Take 10 mg by mouth daily.     No facility-administered medications prior to visit.      ROS:  Review of Systems see HPI    OBJECTIVE:   Vitals:  BP 97/75   Pulse 74   Wt 163 lb 4.8 oz (74.1 kg)   LMP 08/18/2024 (Approximate)   BMI 24.83 kg/m   Physical Exam Constitutional:      Appearance: Normal appearance.  Cardiovascular:     Rate and Rhythm: Normal rate.  Pulmonary:     Effort: Pulmonary effort is normal.  Abdominal:     General: There is no distension.  Genitourinary:    Comments: Bimanual exam: uterus non gravid, tender over Right lower quadrant, no masses. Adnexa not enlarged no masses  Musculoskeletal:        General: Normal range of motion.     Cervical back: Normal range of motion.  Skin:    General: Skin is warm.  Neurological:     General: No focal deficit present.     Mental Status: She is alert. Mental status is at baseline.  Psychiatric:        Mood and Affect: Mood normal.        Thought Content: Thought content normal.     Results: No results found for this or any previous visit (from the past 24 hours).   Assessment/Plan: Cyst of ovary, unspecified laterality  US  reports not available at time of visit, pt reports 3cm ovarian cyst  Pt already on Nuva Ring, rec  using continuously, Discussed it can take few months for a cyst to resolve.  May consider Pelvic pain clinic for pain with IC.   Rec seeing orthopedist  for hip pain, may self refer to [facility] urgent care   No orders of the defined types were placed in this encounter.    JINNIE HERO [facility], CNM 09/22/2024 4:23 PM       "

## 2024-09-15 ENCOUNTER — Ambulatory Visit: Payer: Self-pay | Admitting: Licensed Practical Nurse

## 2024-09-15 ENCOUNTER — Other Ambulatory Visit: Payer: Self-pay | Admitting: Family Medicine

## 2024-09-15 VITALS — BP 97/75 | HR 74 | Wt 163.3 lb

## 2024-09-15 DIAGNOSIS — N83209 Unspecified ovarian cyst, unspecified side: Secondary | ICD-10-CM

## 2024-09-15 MED ORDER — ESCITALOPRAM OXALATE 10 MG PO TABS: 10.0000 mg | ORAL_TABLET | Freq: Every day | ORAL | 3 refills | Status: AC

## 2024-09-22 ENCOUNTER — Encounter: Payer: Self-pay | Admitting: Licensed Practical Nurse

## 2024-10-01 ENCOUNTER — Ambulatory Visit (INDEPENDENT_AMBULATORY_CARE_PROVIDER_SITE_OTHER): Payer: Self-pay | Admitting: Family Medicine

## 2024-10-01 ENCOUNTER — Ambulatory Visit: Payer: Self-pay | Admitting: Family Medicine

## 2024-10-01 ENCOUNTER — Ambulatory Visit: Payer: Self-pay

## 2024-10-01 ENCOUNTER — Encounter: Payer: Self-pay | Admitting: Family Medicine

## 2024-10-01 VITALS — BP 100/64 | HR 74 | Ht 68.0 in | Wt 165.0 lb

## 2024-10-01 DIAGNOSIS — J029 Acute pharyngitis, unspecified: Secondary | ICD-10-CM

## 2024-10-01 DIAGNOSIS — J0100 Acute maxillary sinusitis, unspecified: Secondary | ICD-10-CM

## 2024-10-01 LAB — POC COVID19/FLU A&B COMBO
Covid Antigen, POC: NEGATIVE
Influenza A Antigen, POC: NEGATIVE
Influenza B Antigen, POC: NEGATIVE

## 2024-10-01 NOTE — Progress Notes (Signed)
 "  Acute Office Visit  Subjective:     Patient ID: Diana [name], female    DOB: 03/02/1991, 34 y.o.   MRN: [id number]  Chief Complaint  Patient presents with   Sore Throat    Sore throat and body aches x 2 days, no fever, fatigue     HPI Patient is in today for URI, Discussed the use of AI scribe software for clinical note transcription with the patient, who gave verbal consent to proceed.  History of Present Illness Diana [name] is a 34 year old female who presents with sore throat and body aches.  Symptoms began on Monday evening with a sore throat and body aches. By Tuesday, she experienced low energy and continued to feel achy and unwell. She notes improvement today compared to yesterday and hopes to return to work tomorrow.  She works as a sales executive and mentioned that her workplace suggested she get tested for the flu due to an outbreak. She was unable to get tested yesterday, so she came in today for the test.  She denies fever and has been managing her symptoms with ibuprofen , which helps with her throat and body aches. She also experiences mild cough and pressure on the left side of her head. She describes a sensation of swelling in her throat.  No significant nasal congestion but feels some pressure when bending over. She has not been producing significant mucus or phlegm.  There is no one sick at home.    Review of Systems  All other systems reviewed and are negative.       Objective:    BP 100/64   Pulse 74   Ht 5' 8 (1.727 m)   Wt 165 lb (74.8 kg)   LMP 09/11/2024 (Approximate)   SpO2 98%   BMI 25.09 kg/m    Physical Exam Vitals and nursing note reviewed.  Constitutional:      Appearance: Normal appearance.  HENT:     Head: Normocephalic.     Right Ear: External ear normal.     Left Ear: External ear normal.     Mouth/Throat:     Mouth: Mucous membranes are moist.     Pharynx: Oropharynx is clear. Posterior oropharyngeal  erythema present.  Eyes:     Conjunctiva/sclera: Conjunctivae normal.  Cardiovascular:     Rate and Rhythm: Normal rate.  Pulmonary:     Effort: Pulmonary effort is normal. No respiratory distress.  Abdominal:     Palpations: Abdomen is soft.  Musculoskeletal:        General: Normal range of motion.  Skin:    General: Skin is warm.  Neurological:     Mental Status: She is alert and oriented to person, place, and time.  Psychiatric:        Mood and Affect: Mood normal.     Results for orders placed or performed in visit on 10/01/24  POC Covid19/Flu A&B Antigen  Result Value Ref Range   Influenza A Antigen, POC Negative Negative   Influenza B Antigen, POC Negative Negative   Covid Antigen, POC Negative Negative        Assessment & Plan:   Problem List Items Addressed This Visit   None Visit Diagnoses       Sore throat    -  Primary   Relevant Orders   POC Covid19/Flu A&B Antigen (Completed)     Acute non-recurrent maxillary sinusitis          Assessment and  Plan Assessment & Plan Acute upper respiratory infection Symptoms suggest viral infection, possibly influenza. No evidence of streptococcal pharyngitis. - Await flu test results. - Continue ibuprofen  for pain. - Use salt water gargles for throat relief. - Consider Mucinex for pressure relief. - Use Sudafed for sinus pressure. - Consider Flonase  for sinus relief. - Contact primary care if symptoms worsen.   No orders of the defined types were placed in this encounter.   No follow-ups on file.  Vinary K Jammal Sarr, MD   "

## 2024-10-01 NOTE — Telephone Encounter (Signed)
 FYI Only or Action Required?: FYI only for provider: appointment scheduled on 10/01/2024.  Patient was last seen in primary care on 07/18/2024 by Towana Small, FNP.  Called Nurse Triage reporting Sore Throat.  Symptoms began several days ago.  Triage Disposition: See Physician Within 24 Hours  Patient/caregiver understands and will follow disposition?: Yes        Copied from CRM #[id number]. Topic: Clinical - Pink Word Triage >> Oct 01, 2024  8:01 AM Zy'onna H wrote: [place] Word triggered transfer to Nurse Triage. See Triage Message for details. >> Oct 01, 2024  8:04 AM Zy'onna H wrote: Reason for Triage: - Patient is experiencing the following symptoms & is requesting a Flu Test - Cough - Chills - Body Aches Patient noted she wants to be seen today, so that she may return to work (possibly) tomorrow. Reason for Disposition  SEVERE throat pain (e.g., excruciating)  Answer Assessment - Initial Assessment Questions This RN scheduled pt an appointment today at a difference office within preferred group region. This RN educated pt on new-worsening symptoms and when to call back. Pt verbalized understanding and agrees to plan.  Onset: Mon evening  A little cough/chest tightness Sore throat, 5-6/10 pain level Body aches Pt wants a flu test Denies difficulty breathing, fever  Protocols used: Sore Throat-A-AH

## 2024-10-01 NOTE — Telephone Encounter (Signed)
 FYI Only or Action Required?: FYI only for provider: appointment scheduled on 10/01/2024.  Patient was last seen in primary care on 07/18/2024 by Towana Small, FNP.  Called Nurse Triage reporting Advice Only.  Symptoms began several days ago.  Triage Disposition: See PCP Within 2 Weeks  Patient/caregiver understands and will follow disposition?: Yes  Patient says she called back in and was able to get an appointment. Does not need a triage. Reason for Disposition  Requesting regular office appointment  Answer Assessment - Initial Assessment Questions 1. REASON FOR CALL: What is the main reason for your call? or How can I best help you?     Already booked appointment. No concerns.  Protocols used: Information Only Call - No Triage-A-AH

## 2024-10-01 NOTE — Telephone Encounter (Signed)
Noted  Pt has an appt  KP 

## 2024-10-13 ENCOUNTER — Telehealth: Payer: Self-pay

## 2024-10-13 DIAGNOSIS — B9689 Other specified bacterial agents as the cause of diseases classified elsewhere: Secondary | ICD-10-CM

## 2024-10-13 DIAGNOSIS — J019 Acute sinusitis, unspecified: Secondary | ICD-10-CM

## 2024-10-13 MED ORDER — AMOXICILLIN-POT CLAVULANATE 875-125 MG PO TABS: 1.0000 | ORAL_TABLET | Freq: Two times a day (BID) | ORAL | 0 refills | Status: AC

## 2024-10-13 NOTE — Progress Notes (Signed)
 " Virtual Visit Consent   Diana [name], you are scheduled for a virtual visit with a [facility] provider today. Just as with appointments in the office, your consent must be obtained to participate. Your consent will be active for this visit and any virtual visit you may have with one of our providers in the next 365 days. If you have a MyChart account, a copy of this consent can be sent to you electronically.  As this is a virtual visit, video technology does not allow for your provider to perform a traditional examination. This may limit your provider's ability to fully assess your condition. If your provider identifies any concerns that need to be evaluated in person or the need to arrange testing (such as labs, EKG, etc.), we will make arrangements to do so. Although advances in technology are sophisticated, we cannot ensure that it will always work on either your end or our end. If the connection with a video visit is poor, the visit may have to be switched to a telephone visit. With either a video or telephone visit, we are not always able to ensure that we have a secure connection.  By engaging in this virtual visit, you consent to the provision of healthcare and authorize for your insurance to be billed (if applicable) for the services provided during this visit. Depending on your insurance coverage, you may receive a charge related to this service.  I need to obtain your verbal consent now. Are you willing to proceed with your visit today? Diana [name] has provided verbal consent on 10/13/2024 for a virtual visit (video or telephone). Delon CHRISTELLA Dickinson, PA-C  Date: 10/13/2024 8:52 AM   Virtual Visit via Video Note   I, Delon CHRISTELLA Dickinson, connected with  Diana [name]  ([id number], 12-Aug-1991) on 10/13/24 at  8:45 AM EST by a video-enabled telemedicine application and verified that I am speaking with the correct person using two identifiers.  Location: Patient: Virtual  Visit Location Patient: Home Provider: Virtual Visit Location Provider: Home Office   I discussed the limitations of evaluation and management by telemedicine and the availability of in person appointments. The patient expressed understanding and agreed to proceed.    History of Present Illness: Diana [name] is a 34 y.o. who identifies as a female who was assigned female at birth, and is being seen today for sinus congestion and pain.  HPI: Sinusitis This is a new problem. The current episode started 1 to 4 weeks ago (Seen in person on 10/01/24 and had negative Covid +Flu testing, suggested most likely Viral URI- advised Sudafed and Flonase  for sinus congestion; Improved but symptoms returned last week). The problem has been gradually worsening since onset. There has been no fever. The pain is moderate. Associated symptoms include congestion, coughing (mild, dry), ear pain (pressure and pain), headaches, sinus pressure and a sore throat (initial, now improved). Pertinent negatives include no chills, diaphoresis, hoarse voice or shortness of breath. (Rhinorrhea and post nasal drainage) Treatments tried: mucinex sinus max, dayquil, nyquil. The treatment provided no relief.     Problems:  Patient Active Problem List   Diagnosis Date Noted   Orthostatic lightheadedness 03/14/2024   Asymptomatic varicose veins of both lower extremities 02/15/2024   Overweight with body mass index (BMI) of 28 to 28.9 in adult 12/13/2023   Cyst of right ovary 12/13/2023   Anxiety 06/03/2021    Allergies: Allergies[1] Medications: Current Medications[2]  Observations/Objective: Patient is well-developed, well-nourished in no  acute distress.  Resting comfortably at home.  Head is normocephalic, atraumatic.  No labored breathing.  Speech is clear and coherent with logical content.  Patient is alert and oriented at baseline.    Assessment and Plan: 1. Acute bacterial sinusitis (Primary) -  amoxicillin -clavulanate (AUGMENTIN ) 875-125 MG tablet; Take 1 tablet by mouth 2 (two) times daily.  Dispense: 14 tablet; Refill: 0  - Worsening symptoms that have not responded to OTC medications.  - Will give Augmentin  - Continue allergy medications.  - Steam and humidifier can help - Stay well hydrated and get plenty of rest.  - Seek in person evaluation if no symptom improvement or if symptoms worsen   Follow Up Instructions: I discussed the assessment and treatment plan with the patient. The patient was provided an opportunity to ask questions and all were answered. The patient agreed with the plan and demonstrated an understanding of the instructions.  A copy of instructions were sent to the patient via MyChart unless otherwise noted below.    The patient was advised to call back or seek an in-person evaluation if the symptoms worsen or if the condition fails to improve as anticipated.    Delon HERO Charod Slawinski, PA-C     [1] No Known Allergies [2]  Current Outpatient Medications:    amoxicillin -clavulanate (AUGMENTIN ) 875-125 MG tablet, Take 1 tablet by mouth 2 (two) times daily., Disp: 14 tablet, Rfl: 0   escitalopram  (LEXAPRO ) 10 MG tablet, Take 1 tablet (10 mg total) by mouth daily., Disp: 90 tablet, Rfl: 3   etonogestrel -ethinyl estradiol  (NUVARING) 0.12-0.015 MG/24HR vaginal ring, Insert vaginally and leave in place for 3 consecutive weeks, then remove for 1 week., Disp: 1 each, Rfl: 12   ibuprofen  (ADVIL ) 600 MG tablet, Take by mouth., Disp: , Rfl:    Multiple Vitamin (MULTI-VITAMIN) tablet, Take 1 tablet by mouth daily., Disp: , Rfl:    tirzepatide  (ZEPBOUND ) 2.5 MG/0.5ML injection vial, Inject 2.5 mg into the skin once a week., Disp: 2.125 mL, Rfl: 1   triamcinolone ointment (KENALOG) 0.1 %, Apply topically 2 (two) times daily., Disp: , Rfl:   "

## 2024-10-13 NOTE — Patient Instructions (Signed)
 " Diana [name], thank you for joining Diana CHRISTELLA Dickinson, PA-C for today's virtual visit.  While this provider is not your primary care provider (PCP), if your PCP is located in our provider database this encounter information will be shared with them immediately following your visit.   A [facility] MyChart account gives you access to today's visit and all your visits, tests, and labs performed at [facility]  click here if you don't have a [facility] MyChart account or go to [email or web address]  Consent: (Patient) Diana [name] provided verbal consent for this virtual visit at the beginning of the encounter.  Current Medications:  Current Outpatient Medications:    amoxicillin -clavulanate (AUGMENTIN ) 875-125 MG tablet, Take 1 tablet by mouth 2 (two) times daily., Disp: 14 tablet, Rfl: 0   escitalopram  (LEXAPRO ) 10 MG tablet, Take 1 tablet (10 mg total) by mouth daily., Disp: 90 tablet, Rfl: 3   etonogestrel -ethinyl estradiol  (NUVARING) 0.12-0.015 MG/24HR vaginal ring, Insert vaginally and leave in place for 3 consecutive weeks, then remove for 1 week., Disp: 1 each, Rfl: 12   ibuprofen  (ADVIL ) 600 MG tablet, Take by mouth., Disp: , Rfl:    Multiple Vitamin (MULTI-VITAMIN) tablet, Take 1 tablet by mouth daily., Disp: , Rfl:    tirzepatide  (ZEPBOUND ) 2.5 MG/0.5ML injection vial, Inject 2.5 mg into the skin once a week., Disp: 2.125 mL, Rfl: 1   triamcinolone ointment (KENALOG) 0.1 %, Apply topically 2 (two) times daily., Disp: , Rfl:    Medications ordered in this encounter:  Meds ordered this encounter  Medications   amoxicillin -clavulanate (AUGMENTIN ) 875-125 MG tablet    Sig: Take 1 tablet by mouth 2 (two) times daily.    Dispense:  14 tablet    Refill:  0    Supervising Provider:   BLAISE ALEENE KIDD [[id number]]     *If you need refills on other medications prior to your next appointment, please contact your pharmacy*  Follow-Up: Call back or seek an  in-person evaluation if the symptoms worsen or if the condition fails to improve as anticipated.  [facility] [phone]  Other Instructions Sinus Infection, Adult A sinus infection, also called sinusitis, is inflammation of your sinuses. Sinuses are hollow spaces in the bones around your face. Your sinuses are located: Around your eyes. In the middle of your forehead. Behind your nose. In your cheekbones. Mucus normally drains out of your sinuses. When your nasal tissues become inflamed or swollen, mucus can become trapped or blocked. This allows bacteria, viruses, and fungi to grow, which leads to infection. Most infections of the sinuses are caused by a virus. A sinus infection can develop quickly. It can last for up to 4 weeks (acute) or for more than 12 weeks (chronic). A sinus infection often develops after a cold. What are the causes? This condition is caused by anything that creates swelling in the sinuses or stops mucus from draining. This includes: Allergies. Asthma. Infection from bacteria or viruses. Deformities or blockages in your nose or sinuses. Abnormal growths in the nose (nasal polyps). Pollutants, such as chemicals or irritants in the air. Infection from fungi. This is rare. What increases the risk? You are more likely to develop this condition if you: Have a weak body defense system (immune system). Do a lot of swimming or diving. Overuse nasal sprays. Smoke. What are the signs or symptoms? The main symptoms of this condition are pain and a feeling of pressure around the affected sinuses. Other symptoms  include: Stuffy nose or congestion that makes it difficult to breathe through your nose. Thick yellow or greenish drainage from your nose. Tenderness, swelling, and warmth over the affected sinuses. A cough that may get worse at night. Decreased sense of smell and taste. Extra mucus that collects in the throat or the back of the nose (postnasal  drip) causing a sore throat or bad breath. Tiredness (fatigue). Fever. How is this diagnosed? This condition is diagnosed based on: Your symptoms. Your medical history. A physical exam. Tests to find out if your condition is acute or chronic. This may include: Checking your nose for nasal polyps. Viewing your sinuses using a device that has a light (endoscope). Testing for allergies or bacteria. Imaging tests, such as an MRI or CT scan. In rare cases, a bone biopsy may be done to rule out more serious types of fungal sinus disease. How is this treated? Treatment for a sinus infection depends on the cause and whether your condition is chronic or acute. If caused by a virus, your symptoms should go away on their own within 10 days. You may be given medicines to relieve symptoms. They include: Medicines that shrink swollen nasal passages (decongestants). A spray that eases inflammation of the nostrils (topical intranasal corticosteroids). Rinses that help get rid of thick mucus in your nose (nasal saline washes). Medicines that treat allergies (antihistamines). Over-the-counter pain relievers. If caused by bacteria, your health care provider may recommend waiting to see if your symptoms improve. Most bacterial infections will get better without antibiotic medicine. You may be given antibiotics if you have: A severe infection. A weak immune system. If caused by narrow nasal passages or nasal polyps, surgery may be needed. Follow these instructions at home: Medicines Take, use, or apply over-the-counter and prescription medicines only as told by your health care provider. These may include nasal sprays. If you were prescribed an antibiotic medicine, take it as told by your health care provider. Do not stop taking the antibiotic even if you start to feel better. Hydrate and humidify  Drink enough fluid to keep your urine pale yellow. Staying hydrated will help to thin your mucus. Use a  cool mist humidifier to keep the humidity level in your home above 50%. Inhale steam for 10-15 minutes, 3-4 times a day, or as told by your health care provider. You can do this in the bathroom while a hot shower is running. Limit your exposure to cool or dry air. Rest Rest as much as possible. Sleep with your head raised (elevated). Make sure you get enough sleep each night. General instructions  Apply a warm, moist washcloth to your face 3-4 times a day or as told by your health care provider. This will help with discomfort. Use nasal saline washes as often as told by your health care provider. Wash your hands often with soap and water to reduce your exposure to germs. If soap and water are not available, use hand sanitizer. Do not smoke. Avoid being around people who are smoking (secondhand smoke). Keep all follow-up visits. This is important. Contact a health care provider if: You have a fever. Your symptoms get worse. Your symptoms do not improve within 10 days. Get help right away if: You have a severe headache. You have persistent vomiting. You have severe pain or swelling around your face or eyes. You have vision problems. You develop confusion. Your neck is stiff. You have trouble breathing. These symptoms may be an emergency. Get help right away.  Call 911. Do not wait to see if the symptoms will go away. Do not drive yourself to the hospital. Summary A sinus infection is soreness and inflammation of your sinuses. Sinuses are hollow spaces in the bones around your face. This condition is caused by nasal tissues that become inflamed or swollen. The swelling traps or blocks the flow of mucus. This allows bacteria, viruses, and fungi to grow, which leads to infection. If you were prescribed an antibiotic medicine, take it as told by your health care provider. Do not stop taking the antibiotic even if you start to feel better. Keep all follow-up visits. This is important. This  information is not intended to replace advice given to you by your health care provider. Make sure you discuss any questions you have with your health care provider. Document Revised: 08/16/2021 Document Reviewed: 08/16/2021 Elsevier Patient Education  2024 Elsevier Inc[employer]   If you have been instructed to have an in-person evaluation today at a local Urgent Care facility, please use the link below. It will take you to a list of all of our available [facility], including address, phone number and hours of operation. Please do not delay care.  [facility]  If you or a family member do not have a primary care provider, use the link below to schedule a visit and establish care. When you choose a [facility] primary care physician or advanced practice provider, you gain a long-term partner in health. Find a Primary Care Provider  Learn more about [facility]'s in-office and virtual care options:  - Get Care Now "

## 2024-10-22 ENCOUNTER — Ambulatory Visit: Payer: Self-pay | Admitting: Family Medicine

## 2024-10-22 ENCOUNTER — Encounter: Payer: Self-pay | Admitting: Family Medicine

## 2024-10-22 VITALS — BP 120/88 | HR 58 | Resp 16 | Ht 68.0 in | Wt 164.0 lb

## 2024-10-22 DIAGNOSIS — R0789 Other chest pain: Secondary | ICD-10-CM

## 2024-10-22 DIAGNOSIS — F411 Generalized anxiety disorder: Secondary | ICD-10-CM

## 2024-10-22 NOTE — Progress Notes (Signed)
 "     Acute Care Office Visit  Subjective:   Diana [name] 05-04-1991 10/22/2024  Chief Complaint  Patient presents with   Chest Pain    Inhaled food over weekend and now chest hurts.   Discussed the use of AI scribe software for clinical note transcription with the patient, who gave verbal consent to proceed.  History of Present Illness   Diana [name] is a 34 year old female who presents with chest pressure and tightness.  She experiences a sensation of pressure and tightness in the middle of her chest, which began over the weekend. The feeling persists even after taking deep breaths and is more noticeable towards the end of the day, especially after work or physical activity. No significant shortness of breath, but there is a slight increase in the need to take deeper breaths. No chest congestion, wheezing, or cough.  She recalls inhaling a piece of food recently, causing chest discomfort, but she did not choke or require the Heimlich maneuver. She wonders if this incident is related to her current symptoms.  Increased stress due to her father's health issues and her mother's mental stress might be contributing to her symptoms. She has experienced more frequent headaches over the past two to three weeks, although she did not have one on the day of the visit. She has a history of her heart skipping beats, particularly when stressed or active, which she considers normal for her. No arm, jaw, or significant back pain, and she reports normal sleep patterns, although she feels more tired due to recent snow and less physical activity. She reports noticing an increase in anxiety in the past but did not notice similar symptoms.   She is not a smoker. She uses the NuvaRing for birth control. She has been on antibiotics recently and is concerned about potential chest infections, although she is not experiencing significant coughing or congestion.  Her father has heart issues, including  atrial fibrillation, and recently had pneumonia.         10/22/2024    4:09 PM 07/18/2024    8:55 AM 04/18/2024    8:39 AM 03/14/2024    9:04 AM  GAD 7 : Generalized Anxiety Score  Nervous, Anxious, on Edge 2 0  0  2   Control/stop worrying 1 1  0  2   Worry too much - different things 1 1  0  2   Trouble relaxing 1 1  0  2   Restless 1 0  0  0   Easily annoyed or irritable 1 1  0  1   Afraid - awful might happen 1 1  0  1   Total GAD 7 Score 8 5 0 10  Anxiety Difficulty  Not difficult at all Not difficult at all Not difficult at all     Data saved with a previous flowsheet row definition      10/22/2024    4:09 PM 07/18/2024    8:55 AM 04/18/2024    8:37 AM  PHQ9 SCORE ONLY  PHQ-9 Total Score 0 2 1      Data saved with a previous flowsheet row definition   The following portions of the patient's history were reviewed and updated as appropriate: past medical history, past surgical history, family history, social history, allergies, medications, and problem list.   Patient Active Problem List   Diagnosis Date Noted   Orthostatic lightheadedness 03/14/2024   Asymptomatic varicose veins of both lower  extremities 02/15/2024   Overweight with body mass index (BMI) of 28 to 28.9 in adult 12/13/2023   Cyst of right ovary 12/13/2023   Anxiety 06/03/2021   Past Medical History:  Diagnosis Date   Abnormal Pap smear of cervix    History of Papanicolaou smear of cervix 05/10/2015; [id number]   neg; neg   Immunization, viral disease    gardasil completed   Past Surgical History:  Procedure Laterality Date   AUGMENTATION MAMMAPLASTY     COLPOSCOPY  12/19/2010   lgsil   Family History  Problem Relation Age of Onset   Diabetes Mother        Type 2   Atrial fibrillation Father    Diabetes Maternal Grandmother    Atrial fibrillation Sister    Aneurysm Brother        brain - half brother   Cancer Paternal Grandfather        lung   Outpatient Medications Prior to Visit   Medication Sig Dispense Refill   escitalopram  (LEXAPRO ) 10 MG tablet Take 1 tablet (10 mg total) by mouth daily. 90 tablet 3   etonogestrel -ethinyl estradiol  (NUVARING) 0.12-0.015 MG/24HR vaginal ring Insert vaginally and leave in place for 3 consecutive weeks, then remove for 1 week. 1 each 12   ibuprofen  (ADVIL ) 600 MG tablet Take by mouth.     Multiple Vitamin (MULTI-VITAMIN) tablet Take 1 tablet by mouth daily.     tirzepatide  (ZEPBOUND ) 2.5 MG/0.5ML injection vial Inject 2.5 mg into the skin once a week. 2.125 mL 1   triamcinolone ointment (KENALOG) 0.1 % Apply topically 2 (two) times daily.     amoxicillin -clavulanate (AUGMENTIN ) 875-125 MG tablet Take 1 tablet by mouth 2 (two) times daily. (Patient not taking: Reported on 10/22/2024) 14 tablet 0   No facility-administered medications prior to visit.   Allergies[1]  ROS: A complete ROS was performed with pertinent positives/negatives noted in the HPI. The remainder of the ROS are negative.    Objective:   Today's Vitals   10/22/24 1512  BP: 120/88  Pulse: (!) 58  Resp: 16  SpO2: 98%  Weight: 164 lb (74.4 kg)  Height: 5' 8 (1.727 m)  PainSc: 0-No pain   GENERAL: Well-appearing, in NAD. Well nourished.  SKIN: Pink, warm and dry. No rash, lesion, ulceration, or ecchymoses.  Head: Normocephalic. NECK: Trachea midline. Full ROM w/o pain or tenderness. No lymphadenopathy.  EARS: Tympanic membranes are intact, translucent without bulging and without drainage. Appropriate landmarks visualized.  EYES: Conjunctiva clear without exudates. EOMI, PERRL, no drainage present.  NOSE: Septum midline w/o deformity. Nares patent, mucosa pink and non-inflamed w/o drainage. No sinus tenderness.  THROAT: Uvula midline. Oropharynx clear. Tonsils non-inflamed without exudate. Mucous membranes pink and moist.  RESPIRATORY: Chest wall symmetrical. Respirations even and non-labored. Breath sounds clear to auscultation bilaterally.  CARDIAC: S1, S2  present, regular rate and rhythm without murmur or gallops. Peripheral pulses 2+ bilaterally.  MSK: Muscle tone and strength appropriate for age. Joints w/o tenderness, redness, or swelling.  EXTREMITIES: Without clubbing, cyanosis, or edema.  NEUROLOGIC: No motor or sensory deficits. Steady, even gait. C2-C12 intact.  PSYCH/MENTAL STATUS: Alert, oriented x 3. Cooperative, appropriate mood and affect.      Assessment & Plan:   1. Chest pressure (Primary) Intermittent chest tightness and pressure, exacerbated by activity and stress. EKG showed sinus bradycardia at 56 bpm. Normal vital signs. Denies jaw pain, arm pain, headache, vision changes, one-sided weakness, peripheral edema, abdominal pain, nausea/vomiting, dyspnea, and  dizziness. Differential includes stress-related symptoms, musculoskeletal pain, and less likely cardiac or pulmonary issues. Stress and anxiety likely contributing factors. Review of previous labs (done in Oct 2025) showed normal CBC, electrolytes, and thyroid function. Ordered chest x-ray to rule out pulmonary issues. Monitor symptoms and consider further workup if symptoms worsen or new symptoms develop. - EKG 12-Lead - DG Chest 2 View; Future  2. GAD (generalized anxiety disorder) GAD completed with score of 8, increased from previous score of 5. Increased stress due to family health issues, most likely contributing to chest symptoms. Current medication is Lexapro  10 mg daily. Discussed anxiety-related chest symptoms and medication options. Consider as-needed anxiety medication or further increase in Lexapro  if symptoms persist. Monitor anxiety symptoms and adjust treatment as needed. Patient will reach out if she would like to proceed with either of these options.      Return if symptoms worsen or fail to improve.    Patient to reach out to office if new, worrisome, or unresolved symptoms arise or if no improvement in patient's condition. Patient verbalized understanding  and is agreeable to treatment plan. All questions answered to patient's satisfaction.    Evalene Arts, FNP      [1] No Known Allergies  "

## 2024-10-24 ENCOUNTER — Ambulatory Visit
Admission: RE | Admit: 2024-10-24 | Discharge: 2024-10-24 | Disposition: A | Payer: Self-pay | Attending: Family Medicine | Admitting: Family Medicine

## 2024-10-24 ENCOUNTER — Ambulatory Visit
Admission: RE | Admit: 2024-10-24 | Discharge: 2024-10-24 | Disposition: A | Payer: Self-pay | Source: Ambulatory Visit | Attending: Family Medicine

## 2024-10-24 DIAGNOSIS — R0789 Other chest pain: Secondary | ICD-10-CM

## 2024-10-27 ENCOUNTER — Ambulatory Visit: Payer: Self-pay | Admitting: Family Medicine

## 2025-01-16 ENCOUNTER — Ambulatory Visit: Admitting: Family Medicine
